# Patient Record
Sex: Female | Born: 1945
Health system: Southern US, Community
[De-identification: ages and names within clinical notes are randomized; demographics above are authoritative.]

## PROBLEM LIST (undated history)

## (undated) DIAGNOSIS — Z8489 Family history of other specified conditions: Secondary | ICD-10-CM

## (undated) DIAGNOSIS — R600 Localized edema: Secondary | ICD-10-CM

## (undated) DIAGNOSIS — G43909 Migraine, unspecified, not intractable, without status migrainosus: Secondary | ICD-10-CM

## (undated) DIAGNOSIS — E7800 Pure hypercholesterolemia, unspecified: Secondary | ICD-10-CM

## (undated) DIAGNOSIS — K219 Gastro-esophageal reflux disease without esophagitis: Secondary | ICD-10-CM

## (undated) DIAGNOSIS — Z8719 Personal history of other diseases of the digestive system: Secondary | ICD-10-CM

## (undated) DIAGNOSIS — R011 Cardiac murmur, unspecified: Secondary | ICD-10-CM

## (undated) DIAGNOSIS — I872 Venous insufficiency (chronic) (peripheral): Secondary | ICD-10-CM

## (undated) DIAGNOSIS — Q245 Malformation of coronary vessels: Secondary | ICD-10-CM

## (undated) HISTORY — DX: Gastro-esophageal reflux disease without esophagitis: K21.9

## (undated) HISTORY — DX: Venous insufficiency (chronic) (peripheral): I87.2

## (undated) HISTORY — PX: COLONOSCOPY: SHX174

## (undated) HISTORY — DX: Malformation of coronary vessels: Q24.5

## (undated) HISTORY — PX: UPPER GI ENDOSCOPY: SHX6162

## (undated) HISTORY — DX: Localized edema: R60.0

## (undated) HISTORY — PX: LEG SURGERY: SHX1003

## (undated) HISTORY — PX: LASER ABLATION: SHX1947

## (undated) HISTORY — DX: Pure hypercholesterolemia, unspecified: E78.00

## (undated) HISTORY — PX: ABDOMINAL HYSTERECTOMY: SHX81

## (undated) HISTORY — PX: BUNIONECTOMY: SHX129

---

## 2003-05-31 ENCOUNTER — Ambulatory Visit (HOSPITAL_COMMUNITY): Admission: RE | Admit: 2003-05-31 | Discharge: 2003-05-31 | Payer: Self-pay | Admitting: *Deleted

## 2004-07-31 ENCOUNTER — Encounter: Admission: RE | Admit: 2004-07-31 | Discharge: 2004-07-31 | Payer: Self-pay | Admitting: Internal Medicine

## 2004-09-04 ENCOUNTER — Encounter: Admission: RE | Admit: 2004-09-04 | Discharge: 2004-09-04 | Payer: Self-pay | Admitting: Internal Medicine

## 2004-09-11 ENCOUNTER — Encounter: Admission: RE | Admit: 2004-09-11 | Discharge: 2004-09-11 | Payer: Self-pay | Admitting: Internal Medicine

## 2004-10-02 ENCOUNTER — Encounter: Admission: RE | Admit: 2004-10-02 | Discharge: 2004-10-02 | Payer: Self-pay | Admitting: Internal Medicine

## 2004-10-07 ENCOUNTER — Encounter: Admission: RE | Admit: 2004-10-07 | Discharge: 2004-10-07 | Payer: Self-pay | Admitting: Diagnostic Radiology

## 2004-11-19 ENCOUNTER — Encounter: Admission: RE | Admit: 2004-11-19 | Discharge: 2004-11-19 | Payer: Self-pay | Admitting: Interventional Radiology

## 2005-03-12 ENCOUNTER — Encounter: Admission: RE | Admit: 2005-03-12 | Discharge: 2005-03-12 | Payer: Self-pay | Admitting: Internal Medicine

## 2006-01-22 ENCOUNTER — Ambulatory Visit (HOSPITAL_COMMUNITY): Admission: RE | Admit: 2006-01-22 | Discharge: 2006-01-22 | Payer: Self-pay | Admitting: *Deleted

## 2010-08-08 NOTE — Op Note (Signed)
NAMECHARNETTE, Casey Woods               ACCOUNT NO.:  [id number]   MEDICAL RECORD NO.:  [id number]          PATIENT TYPE:  AMB   LOCATION:  ENDO                         FACILITY:  [facility]   PHYSICIAN:  Georgiana Spinner, M.D.    DATE OF BIRTH:  [birth year]   DATE OF PROCEDURE:  01/22/2006  DATE OF DISCHARGE:                                 OPERATIVE REPORT   PROCEDURE:  Upper endoscopy.   INDICATIONS:  Gastroesophageal reflux disease.   ANESTHESIA:  Demerol 50, Versed 5 mg.   PROCEDURE:  With the patient mildly sedated in the left lateral decubitus  position, the Olympus videoscopic endoscope was inserted and passed under  direct vision through the esophagus, which appeared normal. Then into the  stomach fundus, body, antrum, duodenal bulb, second portion duodenum were  visualized.  From this point the endoscope was slowly withdrawn, taking  circumferential views of duodenal mucosa; until the endoscope had been  pulled back into the stomach and placed in retroflexion, to view the stomach  from below.  The endoscope was straightened and withdrawn, taking  circumferential views of the remaining gastric and esophageal mucosa.  The  patient's vital signs, pulse oximetry remained stable.  The patient  tolerated the procedure well without apparent complication.   FINDINGS:  Unremarkable examination.   PLAN:  Have the patient follow up with me as needed.           ______________________________  Georgiana Spinner, M.D.     GMO/MEDQ  D:  01/22/2006  T:  01/22/2006  Job:  [id number]

## 2010-08-08 NOTE — Op Note (Signed)
NAMEALDEN, FEAGAN                         ACCOUNT NO.:  [id number]   MEDICAL RECORD NO.:  [id number]                   PATIENT TYPE:  AMB   LOCATION:  ENDO                                 FACILITY:  [facility]   PHYSICIAN:  Georgiana Spinner, M.D.                 DATE OF BIRTH:  07/18/1945   DATE OF PROCEDURE:  05/31/2003  DATE OF DISCHARGE:                                 OPERATIVE REPORT   PROCEDURE:  Colonoscopy.   INDICATIONS FOR PROCEDURE:  Colon cancer screening.   ANESTHESIA:  Demerol 70, Versed 7 mg.   PROCEDURE:  With the patient mildly sedated in the left lateral decubitus  position, the Olympus videoscopic colonoscope was inserted in the rectum and  passed under direct vision to the cecum identified by the ileocecal valve  and appendiceal orifice.  We entered into the terminal ileum, all these were  photographed.  From this point, the colonoscope was slowly withdrawn taking  circumferential views of the colonic mucosa stopping only in the rectum  which appeared normal on direct and retroflex view.  The endoscope was  straightened and withdrawn.  The patient's vital signs and pulse oximeter  remained stable.  The patient tolerated the procedure well without apparent  complications.   FINDINGS:  Tortuous colon, but otherwise, unremarkable examination.   PLAN:  Because of family history, consider repeat examination in about five  years.                                               Georgiana Spinner, M.D.    GMO/MEDQ  D:  05/31/2003  T:  05/31/2003  Job:  [id number]

## 2010-08-22 ENCOUNTER — Other Ambulatory Visit: Payer: Self-pay | Admitting: Internal Medicine

## 2010-08-22 ENCOUNTER — Ambulatory Visit
Admission: RE | Admit: 2010-08-22 | Discharge: 2010-08-22 | Disposition: A | Discharge status: Home / Self Care | Payer: BC Managed Care – PPO | Source: Ambulatory Visit | Attending: Internal Medicine | Admitting: Internal Medicine

## 2010-08-22 DIAGNOSIS — R609 Edema, unspecified: Secondary | ICD-10-CM

## 2010-10-17 ENCOUNTER — Encounter: Payer: BC Managed Care – PPO | Admitting: Vascular Surgery

## 2010-10-23 ENCOUNTER — Encounter: Payer: Self-pay | Admitting: Vascular Surgery

## 2010-10-24 ENCOUNTER — Ambulatory Visit (INDEPENDENT_AMBULATORY_CARE_PROVIDER_SITE_OTHER): Payer: BC Managed Care – PPO | Admitting: Vascular Surgery

## 2010-10-24 ENCOUNTER — Encounter: Payer: Self-pay | Admitting: Vascular Surgery

## 2010-10-24 VITALS — BP 135/75 | HR 66 | Resp 20 | Ht 71.0 in | Wt 223.8 lb

## 2010-10-24 DIAGNOSIS — I872 Venous insufficiency (chronic) (peripheral): Secondary | ICD-10-CM

## 2010-10-24 DIAGNOSIS — M7989 Other specified soft tissue disorders: Secondary | ICD-10-CM

## 2010-10-24 NOTE — Progress Notes (Signed)
[facility] [place]  Referred by: Dr. Merri Brunette  Reason for referral: Swollen L leg  History of Present Illness  Casey Woods is a 65 y.o. female who presents with chief complaint: L swollen leg.  Patient has previously undergone EVLA of R leg for CVI and had swelling in both legs at that time.  Her R leg swelling improved but her L leg swelling as worsened note.  She has been using compression stockings without sufficient response per the pt.  The patient has had no history of DVT,  history of varicose vein, history of venous stasis ulcers on left foot, no history of  Lymphedema and  history of skin changes in lower legs.  There is no family history of venous disorders.  She does intermittent have pain in both leg (L>R) c/w CVI.  Past Medical History  Diagnosis Date  . Venous insufficiency (chronic) (peripheral)   . Edema leg     left > right    Past Surgical History  Procedure Date  . Abdominal hysterectomy   . Laser ablation     right - varicose veins    History   Social History  . Marital Status: Divorced    Spouse Name: N/A    Number of Children: N/A  . Years of Education: N/A   Occupational History  . Not on file.   Social History Main Topics  . Smoking status: Never Smoker   . Smokeless tobacco: Not on file  . Alcohol Use: 0.0 oz/week    0 drink(s) per week  . Drug Use: Not on file  . Sexually Active: Not on file   Other Topics Concern  . Not on file   Social History Narrative  . No narrative on file    Family History  Problem Relation Age of Onset  . Other Mother     PAD w/ hx left BKA   . Heart attack Father     died age 28  . Heart disease Brother     died age 53- CHF    Current outpatient prescriptions:aspirin 81 MG tablet, Take 81 mg by mouth daily.  , Disp: , Rfl: ;  Calcium Citrate-Vitamin D (CITRACAL + D PO), Take by mouth. 315mg /200mg  strength; Take two tabs daily , Disp: , Rfl: ;  Cholecalciferol (VITAMIN D) 2000  UNITS tablet, Take 2,000 Units by mouth daily.  , Disp: , Rfl: ;  esomeprazole (NEXIUM) 40 MG capsule, Take 40 mg by mouth daily before breakfast.  , Disp: , Rfl:  Multiple Vitamins-Minerals (MULTIVITAMIN WITH MINERALS) tablet, Take 1 tablet by mouth daily.  , Disp: , Rfl: ;  glucosamine-chondroitin 500-400 MG tablet, Take 1 tablet by mouth 1 day or 1 dose.  , Disp: , Rfl: ;  pravastatin (PRAVACHOL) 10 MG tablet, Take 10 mg by mouth daily.  , Disp: , Rfl: ;  RABEprazole (ACIPHEX) 20 MG tablet, Take 20 mg by mouth daily.  , Disp: , Rfl:  spironolactone (ALDACTONE) 25 MG tablet, Take 25 mg by mouth daily. Half a tab q day , Disp: , Rfl:   Allergies as of 10/24/2010  . (No Known Allergies)    Review of Systems (Positive items in bold and italic, otherwise negative)  General: Weight loss, Weight gain, Loss of appetite, Fever  Neurologic: Dizziness, Blackouts, Headaches, Seizure  Ear/Nose/Throat: Change in eyesight, Change in hearing, Nose bleeds, Sore throat  Vascular: Pain in legs with walking, Pain in feet while lying flat, Non-healing  ulcer, Stroke, "Mini stroke", Slurred speech, Temporary blindness, Blood clot in vein, Phlebitis  Pulmonary: Home oxygen, Productive cough, Bronchitis, Coughing up blood, Asthma, Wheezing  Musculoskeletal: Arthritis, Joint pain, Muscle pain  Cardiac: Chest pain, Chest tightness/pressure, Shortness of breath when lying flat, Palpitations, Heart murmur, Arrythmia, Atrial fibrillation  Hematologic: Bleeding problems, Clotting disorder, Anemia  Psychiatric:  Depression, Anxiety, Attention deficit disorder  Gastrointestinal:  Black stool, Blood in stool, Peptic ulcer disease, Reflux, Hiatal hernia, Trouble swallowing, Diarrhea, Constipation  Urinary:  Kidney disease, Burning with urination, Frequent urination, Difficulty urinating  Skin: Ulcers, Rashes  Physical Examination  Filed Vitals:   10/24/10 1305  BP: 135/75  Pulse: 66  Resp: 20    General:  A&O x 3, WDWN  Head: [place]/AT  Ear/Nose/Throat: Hearing grossly intact, nares w/o erythema or drainage, oropharynx w/o Erythema/Exudate  Eyes: PERRLA, EOMI  Neck: Supple, no nuchal rigidity, no palpable LAD  Pulmonary: Sym exp, good air movt, CTAB, no rales, rhonchi, & wheezing  Cardiac: RRR, Nl S1, S2, no Murmurs, rubs or gallops  Vascular: Vessel Right Left  Radial Palpable Palpable  Ulnary Palpable Palpable  Brachial Palpable Palpable  Carotid Palpable, without bruit Palpable, without bruit  Aorta Non-palpable N/A  Femoral Palpable Palpable  Popliteal Non-palpable Non-palpable  PT Palpable Palpable  DP Palpable Palpable   Gastrointestinal: soft, NTND, -G/R, - HSM, - masses, - CVAT B  Musculoskeletal: M/S 5/5 throughout, Extremities without ischemic changes, chronic venous skin changes including bilateral lipodermatosclerosis, R leg edema 1+, L leg 1-2+, previously healed ulcer evident on L foot, healed defect in L leg  Neurologic: CN 2-12 intact, Pain and light touch intact in extremities, Motor exam as listed above  Psychiatric: Judgment intact, Mood & affect appropriate for pt's clinical situation  Dermatologic: See M/S exam for extremity exam, no rashes otherwise noted  Lymph : No Cervical, Axillary, or Inguinal lymphadenopathy   Non-Invasive Vascular Imaging  BLE Venous Insufficiency Duplex ordered  Outside Studies/Documentation 5 pages of outside documents were reviewed including: DVT studies  Medical Decision Making  [name] is a 65 y.o. female who presents with: BLE CVI s/p R GSV EVLA.   Based on the patient's history and examination, I recommend: B venous insufficiency duplex to evaluate for CVI.  She likely will have reflux on examination given the skin changes (R leg: C4, L leg: C5) and history  She should continue with her compression stockings.  I have referred her to my partners that perform vein procedures, Dr. Hart Rochester and Dr. Arbie Cookey for  evaluation for EVLA GSV on L leg.  The patient will follow up after her: BLE venous insufficiency duplex   Thank you for allowing [place] to participate in this patient's care.  Leonides Sake, MD [facility] [place] Office: [phone] Pager: [phone]

## 2010-10-24 NOTE — Progress Notes (Signed)
Ref by Dr. Renne Crigler; Chr. Venous insuff. and leg swelling; pt states left > right

## 2010-11-13 ENCOUNTER — Encounter: Payer: Self-pay | Admitting: Vascular Surgery

## 2010-12-22 ENCOUNTER — Encounter: Payer: Self-pay | Admitting: Vascular Surgery

## 2010-12-23 ENCOUNTER — Encounter: Payer: Self-pay | Admitting: Vascular Surgery

## 2010-12-23 ENCOUNTER — Ambulatory Visit (INDEPENDENT_AMBULATORY_CARE_PROVIDER_SITE_OTHER): Payer: BC Managed Care – PPO | Admitting: *Deleted

## 2010-12-23 ENCOUNTER — Ambulatory Visit (INDEPENDENT_AMBULATORY_CARE_PROVIDER_SITE_OTHER): Payer: BC Managed Care – PPO | Admitting: Vascular Surgery

## 2010-12-23 VITALS — BP 146/68 | HR 62 | Resp 18 | Ht 71.0 in | Wt 228.0 lb

## 2010-12-23 DIAGNOSIS — I83893 Varicose veins of bilateral lower extremities with other complications: Secondary | ICD-10-CM

## 2010-12-23 DIAGNOSIS — I83229 Varicose veins of left lower extremity with both ulcer of unspecified site and inflammation: Secondary | ICD-10-CM

## 2010-12-23 DIAGNOSIS — I83219 Varicose veins of right lower extremity with both ulcer of unspecified site and inflammation: Secondary | ICD-10-CM

## 2010-12-23 DIAGNOSIS — I872 Venous insufficiency (chronic) (peripheral): Secondary | ICD-10-CM

## 2010-12-23 DIAGNOSIS — L97919 Non-pressure chronic ulcer of unspecified part of right lower leg with unspecified severity: Secondary | ICD-10-CM

## 2010-12-23 NOTE — Progress Notes (Signed)
New vv bilateral hx of pain, swelling,discoloration, ulceration.

## 2010-12-23 NOTE — Progress Notes (Signed)
Subjective:     Patient ID: Casey Woods, female   DOB: April 06, 1945, 65 y.o.   MRN: [id number]  HPI [facility] [place] HISTORY AND PHYSICAL   Referring Physician: Dr. Merri Brunette History of Present Illness: This 65 year-old female is evaluated today for severe venous insufficiency of both lower extremities. She has previously had laser ablation of the right great saphenous vein performed at [facility] about 4 years ago. Since time she has developed severe worsening of the darkness of her skin in the left leg from the midcalf distally. She also has had a stasis ulcer on the top of her left foot which eventually healed with elastic compression stockings. She has no history of DVT or thrombophlebitis. She had no previous history of stasis ulcers she has no history of bleeding. He has severe aching throbbing and burning discomfort in both legs which worsen as the day progresses left worse than right. Past Medical History  Diagnosis Date  . Venous insufficiency (chronic) (peripheral)   . Edema leg     left > right    ROS: [x]  Positive   [ ]  Negative   [ ]  All sytems reviewed and are negative  General: [ ]  Weight loss, [ ]  Fever, [ ]  chills Neurologic: [ ]  Dizziness, [ ]  Blackouts, [ ]  Seizure [ ]  Stroke, [ ]  "Mini stroke", [ ]  Slurred speech, [ ]  Temporary blindness; [ ]  weakness in arms or legs, [ ]  Hoarseness Cardiac: [ ]  Chest pain/pressure, [ ]  Shortness of breath at rest [ ]  Shortness of breath with exertion, [ ]  Atrial fibrillation or irregular heartbeat Vascular: [ ]  Pain in legs with walking, [ ]  Pain in legs at rest, [ ]  Pain in legs at night,  [x ] Non-healing ulcer, [ ]  Blood clot in vein/DVT,   Pulmonary: [ ]  Home oxygen, [ ]  Productive cough, [ ]  Coughing up blood, [ ]  Asthma,  [ ]  Wheezing Musculoskeletal:  [ ]  Arthritis, [ ]  Low back pain, [ ]  Joint pain Hematologic: [ ]  Easy Bruising, [ ]  Anemia; [ ]  Hepatitis Gastrointestinal: [ ]  Blood in stool, [ ]   Gastroesophageal Reflux/heartburn, [ ]  Trouble swallowing Urinary: [ ]  chronic Kidney disease, [ ]  on HD - [ ]  MWF or [ ]  TTHS, [ ]  Burning with urination, [ ]  Difficulty urinating Skin: [ ]  Rashes, [ ]  Wounds Psychological: [ ]  Anxiety, [ ]  Depression   Social History History  Substance Use Topics  . Smoking status: Never Smoker   . Smokeless tobacco: Not on file  . Alcohol Use: 0.0 oz/week    0 drink(s) per week    Family History Family History  Problem Relation Age of Onset  . Other Mother     PAD w/ hx left BKA   . Heart attack Father     died age 73  . Heart disease Brother     died age 54- CHF    No Known Allergies  Current Outpatient Prescriptions  Medication Sig Dispense Refill  . aspirin 81 MG tablet Take 81 mg by mouth daily.        . Calcium Citrate-Vitamin D (CITRACAL + D PO) Take by mouth. 315mg /200mg  strength; Take two tabs daily       . Cholecalciferol (VITAMIN D) 2000 UNITS tablet Take 2,000 Units by mouth daily.        Marland Kitchen esomeprazole (NEXIUM) 40 MG capsule Take 40 mg by mouth daily before breakfast.        .  Multiple Vitamins-Minerals (MULTIVITAMIN WITH MINERALS) tablet Take 1 tablet by mouth daily.        Marland Kitchen glucosamine-chondroitin 500-400 MG tablet Take 1 tablet by mouth 1 day or 1 dose.        . pravastatin (PRAVACHOL) 10 MG tablet Take 10 mg by mouth daily.        . RABEprazole (ACIPHEX) 20 MG tablet Take 20 mg by mouth daily.        Marland Kitchen spironolactone (ALDACTONE) 25 MG tablet Take 25 mg by mouth daily. Half a tab q day         Physical Examination  Filed Vitals:   12/23/10 1134  BP: 146/68  Pulse: 62  Resp: 18    Body mass index is 31.80 kg/(m^2).  General:  WDWN in NAD Gait: Normal HENT: WNL Eyes: Pupils equal Pulmonary: normal non-labored breathing , without Rales, rhonchi,  wheezing Cardiac: RRR, without  Murmurs, rubs or gallops; No carotid bruits Abdomen: soft, NT, no masses Skin: no rashes, ulcers noted Vascular Exam/Pulses: left  leg is 3+ femoral popliteal and dorsalis pedis pulse palpable. There is severe hyperpigmentation and skin thickening in both legs from the midcalf to the ankles. There is chronic edema (1+). She has bulging varicosities in the left medial calf. There is evidence of a healed stasis ulcer on the dorsum of the left foot. Right leg has bulging varicosities a lateral calf down to the ankle. Right leg has femoral popliteal and dorsalis pedis pulses palpable at 3+.   Extremities without ischemic changes, no Gangrene , no cellulitis; no open wounds;  Musculoskeletal: no muscle wasting or atrophy  Neurologic: A&O X 3; Appropriate Affect ; SENSATION: normal; MOTOR FUNCTION:  moving all extremities equally. Speech is fluent/normal  Non-Invasive Vascular Imaging: today I ordered a venous duplex exam of both lower extremities. Following findings were noted. She has gross reflux in the left great saphenous vein and reflux in the deep system on the left. She also has reflux in the left small saphenous vein which is slightly smaller in caliber the right leg has a few areas of reflux in the great saphenous vein and around the distal thigh to knee but the vein is of small caliber. The right small saphenous vein has gross reflux and is a large vein. The right leg has reflux in the deep system also.  ASSESSMENT: severe bilateral venous insufficiencyPLAN: #1 long elastic compression stockings 20-30 mm gradient #2 elevate legs as much as her job we'll allow #3 ibuprofen when necessary for pain #4 return in 3 months. She has not had dramatic improvement she will need #1 laser ablation left great saphenous vein with 63 - 63 stabs phlebectomy. She will also need laser ablation of right small saphenous vein with possible staph phlebectomy. Return in 3 months for further evaluation     Review of Systems     Objective:   Physical Exam     Assessment:         Plan:

## 2010-12-29 NOTE — Procedures (Unsigned)
LOWER EXTREMITY VENOUS REFLUX EXAM  INDICATION:  Previous right GSV ablation in 2008.  Bilateral varicose veins with swelling.  EXAM:  Using color-flow imaging and pulse Doppler spectral analysis, the bilateral common femoral, superficial femoral, popliteal, posterior tibial, greater and lesser saphenous veins are evaluated.  There is evidence suggesting severe deep venous insufficiency in the bilateral lower extremities.  The left saphenofemoral junction is not competent with Reflux of >575milliseconds. The bilateral GSV is not competent with Reflux of >5104milliseconds with the caliber as described below.  The bilateral proximal short saphenous veins demonstrate incompetency. The right calibers range from 0.59 cm to 0.31 cm.  The left small saphenous vein calibers range from 0.36 cm to 0.31 cm.  GSV Diameter (used if found to be incompetent only)                                                  Right      Left Proximal Greater Saphenous Vein                  0.64 cm    0.85 cm Proximal-to-mid-thigh                            0.34 cm    0.66 cm Mid thigh                                        0.31 cm    0.93 cm  Distal thigh                                     0.29 cm    0.61 cm Knee                                             0.32 cm    0.64 cm  IMPRESSION: 1. Bilateral greater saphenous veins are not competent with reflux     >578milliseconds. 2. The bilateral great saphenous vein is not tortuous. 3. The deep venous system is not competent with reflux of     >556milliseconds. 4. The bilateral small saphenous veins are not competent with Reflux     of >581milliseconds.  ___________________________________________ Quita Skye Hart Rochester, M.D.  LT/MEDQ  D:  12/24/2010  T:  12/24/2010  Job:  [id number]

## 2011-01-16 ENCOUNTER — Telehealth: Payer: Self-pay

## 2011-01-16 NOTE — Telephone Encounter (Signed)
Pt. called with c/o area of swelling approx. "Nickel-size" on right lower leg. States that she was awakened last night with a sensation of a "cramp" in the noted area, and this AM noticed the nickel sized area that was tender and warm and "located in an area of a cluster of veins".  States her legs stay swollen, and that the raised area within the vein cluster is new.  Voiced concern about a blood clot.  Advised pt. to wear compression stockings, elevate leg, and use Ibuprofen, OTC, for tenderness and inflammation.  Discussed w Dr. Imogene Burn.  No new orders at this time/ agreed w/ nursing recommendation.  Pt. advised to call office if symptoms don't improve, or worsen.  Agrees w/ plan.

## 2011-01-16 NOTE — Telephone Encounter (Signed)
Message copied by Phillips Odor on Fri Jan 16, 2011  5:21 PM ------      Message from: Marcellus Scott      Created: Fri Jan 16, 2011  9:33 AM      Contact: [phone]       Casey Woods DOB: 1945-08-31 is having swelling in right leg. She wants to make sure she doesn't have a blood clot on upper right leg. Her tele# is [phone].            Thanks,      Revonda Standard

## 2011-01-17 ENCOUNTER — Emergency Department (HOSPITAL_BASED_OUTPATIENT_CLINIC_OR_DEPARTMENT_OTHER)
Admission: EM | Admit: 2011-01-17 | Discharge: 2011-01-17 | Disposition: A | Discharge status: Home / Self Care | Payer: BC Managed Care – PPO | Attending: Emergency Medicine | Admitting: Emergency Medicine

## 2011-01-17 ENCOUNTER — Encounter (HOSPITAL_BASED_OUTPATIENT_CLINIC_OR_DEPARTMENT_OTHER): Payer: Self-pay | Admitting: *Deleted

## 2011-01-17 DIAGNOSIS — I8001 Phlebitis and thrombophlebitis of superficial vessels of right lower extremity: Secondary | ICD-10-CM

## 2011-01-17 DIAGNOSIS — M79609 Pain in unspecified limb: Secondary | ICD-10-CM | POA: Insufficient documentation

## 2011-01-17 DIAGNOSIS — I8000 Phlebitis and thrombophlebitis of superficial vessels of unspecified lower extremity: Secondary | ICD-10-CM | POA: Insufficient documentation

## 2011-01-17 DIAGNOSIS — Z79899 Other long term (current) drug therapy: Secondary | ICD-10-CM | POA: Insufficient documentation

## 2011-01-17 NOTE — ED Provider Notes (Signed)
History  Scribed for Raeford Razor, MD, the patient was seen in MH08/MH08. The chart was scribed by Gilman Schmidt. The patients care was started at 6:51 PM. CSN: [id number] Arrival date & time: 01/17/2011  4:12 PM   First MD Initiated Contact with Patient 01/17/11 1816      Chief Complaint  Patient presents with  . Leg Pain    HPI Casey Woods is a 65 y.o. female who presents to the Emergency Department complaining of right leg pain. Pt states she sees vein specialist. Two days ago she noted hard painful sight on lateral r thigh. Additionally notes that it initially felt like a cramp. Concerned for blood clot. Denies hx of blood clot but multiple varicose veins and procedures for them.. Denies any fever, chills, chest pain, SOB, history or CP, or any other pain. There are no other associated symptoms and no other alleviating or aggravating factors.    Past Medical History  Diagnosis Date  . Venous insufficiency (chronic) (peripheral)   . Edema leg     left > right    Past Surgical History  Procedure Date  . Abdominal hysterectomy   . Laser ablation     right - varicose veins  . Leg surgery     Family History  Problem Relation Age of Onset  . Other Mother     PAD w/ hx left BKA   . Heart attack Father     died age 82  . Heart disease Brother     died age 55- CHF    History  Substance Use Topics  . Smoking status: Never Smoker   . Smokeless tobacco: Not on file  . Alcohol Use: 0.5 oz/week    1 drink(s) per week    OB History    Grav Para Term Preterm Abortions TAB SAB Ect Mult Living                  Review of Systems  Constitutional: Negative for fever and chills.  Respiratory: Negative for shortness of breath.   Musculoskeletal:       Soreness  Skin: Positive for color change.  All other systems reviewed and are negative.    Allergies  Beef-derived products  Home Medications   Current Outpatient Rx  Name Route Sig Dispense Refill  . ASPIRIN 81  MG PO TABS Oral Take 81 mg by mouth daily.      Marland Kitchen CITRACAL + D PO Oral Take by mouth. 315mg /200mg  strength; Take two tabs daily     . VITAMIN D 2000 UNITS PO TABS Oral Take 2,000 Units by mouth daily.      Marland Kitchen ESOMEPRAZOLE MAGNESIUM 40 MG PO CPDR Oral Take 40 mg by mouth daily before breakfast.      . GLUCOSAMINE-CHONDROITIN 500-400 MG PO TABS Oral Take 1 tablet by mouth 1 day or 1 dose.      . MULTI-VITAMIN/MINERALS PO TABS Oral Take 1 tablet by mouth daily.      Marland Kitchen PRAVASTATIN SODIUM 10 MG PO TABS Oral Take 10 mg by mouth daily.      Marland Kitchen RABEPRAZOLE SODIUM 20 MG PO TBEC Oral Take 20 mg by mouth daily.      Marland Kitchen SPIRONOLACTONE 25 MG PO TABS Oral Take 25 mg by mouth daily. Half a tab q day       BP 124/61  Pulse 77  Temp(Src) 98 F (36.7 C) (Oral)  Resp 20  Ht 5\' 11"  (1.803 m)  Wt 224  lb 13.9 oz (102 kg)  BMI 31.36 kg/m2  SpO2 97%  Physical Exam  Constitutional: She is oriented to person, place, and time. She appears well-developed and well-nourished.  Non-toxic appearance. She does not have a sickly appearance.  HENT:  Head: Normocephalic and atraumatic.  Eyes: Conjunctivae, EOM and lids are normal. Pupils are equal, round, and reactive to light. No scleral icterus.  Neck: Trachea normal and normal range of motion. Neck supple.  Cardiovascular: Regular rhythm and normal heart sounds.   Pulmonary/Chest: Effort normal and breath sounds normal.  Abdominal: Soft. Normal appearance. There is no tenderness. There is no rebound, no guarding and no CVA tenderness.  Musculoskeletal: Normal range of motion.       Superficial lesion R lateral mid thigh. Tender. Indurated. No fluctuance. Mild erythema. Legs symmetric. No calf tenderness. Neurovascularly intact distally.    Neurological: She is alert and oriented to person, place, and time. She has normal strength.       Good dp pulse bilaterally   Skin: Skin is warm, dry and intact. No rash noted.       ED Course  Procedures  DIAGNOSTIC  STUDIES: Oxygen Saturation is 97% on room air, normal by my interpretation.    COORDINATION OF CARE: 6:51pm:  - Patient evaluated by ED physician, US Venous Img Lower Unilateral Right    MDM  65yf with leg pain. Clinical exam consistent with superficial phlebitis. Think that [place] is reasonable but low clinical suspicion for DVT. outpt [place] arranged. Do not feel needs emergently done nor empiric anticoagulation in mean time. Consider infectious etiology but clinically less likely. Afebrile and clinically well appearing. Plan symptomatic tx with warm compresses/NSAIDs and outpt fu.   I personally preformed the services scribed in my presence. The recorded information has been reviewed and considered. Raeford Razor, MD.       Raeford Razor, MD 01/18/11 [phone]

## 2011-01-17 NOTE — ED Notes (Signed)
Peripheral Vascular assessment complete at 1615

## 2011-01-17 NOTE — ED Notes (Signed)
Pt states she sees vein specialist- 2 days ago she noticed ?thrombus in right upper thigh- states has been increasing in size- site is sore

## 2011-01-18 ENCOUNTER — Inpatient Hospital Stay (HOSPITAL_BASED_OUTPATIENT_CLINIC_OR_DEPARTMENT_OTHER): Admit: 2011-01-18 | Payer: BC Managed Care – PPO

## 2011-04-06 ENCOUNTER — Encounter: Payer: Self-pay | Admitting: Vascular Surgery

## 2011-04-07 ENCOUNTER — Encounter: Payer: Self-pay | Admitting: Vascular Surgery

## 2011-04-07 ENCOUNTER — Ambulatory Visit (INDEPENDENT_AMBULATORY_CARE_PROVIDER_SITE_OTHER): Payer: BC Managed Care – PPO | Admitting: Vascular Surgery

## 2011-04-07 VITALS — BP 136/57 | HR 76 | Resp 18 | Ht 71.0 in | Wt 216.0 lb

## 2011-04-07 DIAGNOSIS — I83893 Varicose veins of bilateral lower extremities with other complications: Secondary | ICD-10-CM | POA: Insufficient documentation

## 2011-04-07 DIAGNOSIS — I83009 Varicose veins of unspecified lower extremity with ulcer of unspecified site: Secondary | ICD-10-CM

## 2011-04-07 DIAGNOSIS — L97909 Non-pressure chronic ulcer of unspecified part of unspecified lower leg with unspecified severity: Secondary | ICD-10-CM

## 2011-04-07 NOTE — Progress Notes (Signed)
Subjective:     Patient ID: Casey Woods, female   DOB: [birth year], 66 y.o.   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 She has had previous laser ablation of her right great saphenous vein at [facility] 4 years ago she has had progressive skin darkness and a stasis ulcer in the left foot as well as aching throbbing burning discomfort and chronic edema of the past several years. This involves both lower extremities. She has severe pain in the left medial calf in the right posterior calf as well as the severe thickening of the skin distally. She has no history of DVT. She has had one episode of thrombophlebitis superficially in the left distal lateral thigh. She has been wearing long light elastic compression stockings 20-30 mm gradient and trying elevation as much as her job will allow and ibuprofen with no improvement in her symptoms.   Past Medical History  Diagnosis Date  . Venous insufficiency (chronic) (peripheral)   . Edema leg     left > right    History  Substance Use Topics  . Smoking status: Never Smoker   . Smokeless tobacco: Never Used  . Alcohol Use: 0.5 oz/week    1 drink(s) per week    Family History  Problem Relation Age of Onset  . Other Mother     PAD w/ hx left BKA   . Heart attack Father     died age 30  . Heart disease Brother     died age 34- CHF    Allergies  Allergen Reactions  . Beef-Derived Products     headache    Current outpatient prescriptions:aspirin 81 MG tablet, Take 81 mg by mouth daily.  , Disp: , Rfl: ;  Calcium Citrate-Vitamin D (CITRACAL + D PO), Take by mouth. 315mg /200mg  strength; Take two tabs daily , Disp: , Rfl: ;  Cholecalciferol (VITAMIN D) 2000 UNITS tablet, Take 2,000 Units by mouth daily.  , Disp: , Rfl: ;  esomeprazole (NEXIUM) 40 MG capsule, Take 40 mg by mouth daily before breakfast.  , Disp: , Rfl:  Multiple  Vitamins-Minerals (MULTIVITAMIN WITH MINERALS) tablet, Take 1 tablet by mouth daily.  , Disp: , Rfl: ;  glucosamine-chondroitin 500-400 MG tablet, Take 1 tablet by mouth 1 day or 1 dose.  , Disp: , Rfl: ;  pravastatin (PRAVACHOL) 10 MG tablet, Take 10 mg by mouth daily.  , Disp: , Rfl: ;  RABEprazole (ACIPHEX) 20 MG tablet, Take 20 mg by mouth daily.  , Disp: , Rfl:  spironolactone (ALDACTONE) 25 MG tablet, Take 25 mg by mouth daily. Half a tab q day , Disp: , Rfl:   BP 136/57  Pulse 76  Resp 18  Ht 5\' 11"  (1.803 m)  Wt 216 lb (97.977 kg)  BMI 30.13 kg/m2  Body mass index is 30.13 kg/(m^2).         Review of Systems denies chest pain, dyspnea on exertion, PND, orthopnea, hemoptysis the     Objective:   Physical Exam pressure 136/57 heart rate 76 respirations 18 General well-developed well-nourished female no apparent stress alert and oriented x3 Lungs no rhonchi or wheezing Lower extremity exam 3+ femoral and dorsalis pedis pulses palpable bilaterally. There is severe thickening of the skin from the mid calf to the ankle bilaterally with no active ulcerations but evidence of previous healed ulcerations. There are bulging varicosities in left medial calf the  right posterior calf.  Previous venous duplex exam in our office reveals the following #1 gross reflux in left great saphenous vein throughout #2 gross reflux in left small saphenous vein and #3 gross reflux in right small saphenous vein. 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 Reflux in left great saphenous and bilateral small saphenous veins Plan: Risks and benefits of been thoroughly discussed with patient      Believe we should proceed with #1 laser  ablation left great saphenous vein with 10-20 stab phlebectomy #2 laser ablation right small saphenous vein with 10-20 stab phlebectomy #3 laser ablation left small saphenous vein This has been thoroughly discussed with patient and she would like to proceed. She understands that there is deep venous insufficiency but because of her severe skin changes and recent ulcer left foot I think we should proceed with ablation of the superficial system. Will proceed with precertification to perform this in the near future

## 2011-04-22 ENCOUNTER — Other Ambulatory Visit: Payer: Self-pay | Admitting: *Deleted

## 2011-04-22 DIAGNOSIS — I83899 Varicose veins of unspecified lower extremities with other complications: Secondary | ICD-10-CM

## 2011-05-15 ENCOUNTER — Encounter: Payer: Self-pay | Admitting: Vascular Surgery

## 2011-05-18 ENCOUNTER — Encounter: Payer: Self-pay | Admitting: Vascular Surgery

## 2011-05-18 ENCOUNTER — Ambulatory Visit (INDEPENDENT_AMBULATORY_CARE_PROVIDER_SITE_OTHER): Payer: BC Managed Care – PPO | Admitting: Vascular Surgery

## 2011-05-18 VITALS — BP 172/78 | HR 65 | Resp 20 | Ht 71.0 in | Wt 220.0 lb

## 2011-05-18 DIAGNOSIS — I83893 Varicose veins of bilateral lower extremities with other complications: Secondary | ICD-10-CM

## 2011-05-18 NOTE — Progress Notes (Signed)
Subjective:     Patient ID: Casey Woods, female   DOB: 20-Dec-1945, 66 y.o.   MRN: [id number]  HPI this 66 year old female had laser ablation of the left great saphenous vein performed under local tumescent anesthesia today +10-20 stab phlebectomy for secondary varicosities. She had a total of 1770 J of energy utilized. She tolerated the procedure well.   Review of Systems     Objective:   Physical ExamBP 172/78  Pulse 65  Resp 20  Ht 5\' 11"  (1.803 m)  Wt 220 lb (99.791 kg)  BMI 30.68 kg/m2    Assessment:    well-tolerated laser ablation left great saphenous vein with 10-20 stab phlebectomy under local tumescent anesthesia    Plan:     Return in one week for a venous duplex exam to confirm closure left great saphenous vein Will then be scheduled for similar procedure in the left small saphenous vein and right small saphenous vein

## 2011-05-18 NOTE — Progress Notes (Signed)
Laser Ablation Procedure      Date: 05/18/2011    Casey Woods DOB:[birth year]  Consent signed: Yes  Surgeon:Casey Woods  Procedure: Laser Ablation: left Greater Saphenous Vein  BP 172/78  Pulse 65  Resp 20  Ht 5\' 11"  (1.803 m)  Wt 220 lb (99.791 kg)  BMI 30.68 kg/m2  Start time: 9:15   End time: 10:15  Tumescent Anesthesia: 425 cc 0.9% NaCl with 50 cc Lidocaine HCL with 1% Epi and 15 cc 8.4% NaHCO3  Local Anesthesia: 9 cc Lidocaine HCL and NaHCO3 (ratio 2:1)  Pulsed mode: Watts 15 Seconds 1 Pulses:1 Total Pulses:1770 Total Energy: 118 Total Time: 1:58   Stab Phlebectomy: 10-20 Sites: Calf  Patient tolerated procedure well: Yes  Notes:   Description of Procedure:  After marking the course of the saphenous vein and the secondary varicosities in the standing position, the patient was placed on the operating table in the supine position, and the left leg was prepped and draped in sterile fashion. Local anesthetic was administered, and under ultrasound guidance the saphenous vein was accessed with a micro needle and guide wire; then the micro puncture sheath was placed. A guide wire was inserted to the saphenofemoral junction, followed by a 5 french sheath.  The position of the sheath and then the laser fiber below the junction was confirmed using the ultrasound and visualization of the aiming beam.  Tumescent anesthesia was administered along the course of the saphenous vein using ultrasound guidance. Protective laser glasses were placed on the patient, and the laser was fired at 15 watt pulsed mode advancing 1-2 mm per sec.  For a total of 1770 joules.  A steri strip was applied to the puncture site.  The patient was then put into Trendelenburg position.  Local anesthetic was utilized overlying the marked varicosities.  Ten to 20 stab wounds were made using the tip of an 11 blade; and using the vein hook,  The phlebectomies were performed using a hemostat to avulse these  varicosities.  Adequate hemostasis was achieved, and steri strips were applied to the stab wound.    ABD pads and thigh high compression stockings were applied.  Ace wrap bandages were applied over the phlebectomy sites and at the top of the saphenofemoral junction.  Blood loss was less than 15 cc.  The patient ambulated out of the operating room having tolerated the procedure well.

## 2011-05-19 ENCOUNTER — Encounter: Payer: Self-pay | Admitting: Vascular Surgery

## 2011-05-19 ENCOUNTER — Telehealth: Payer: Self-pay | Admitting: *Deleted

## 2011-05-19 NOTE — Telephone Encounter (Signed)
Left a voice mail for the patient to call me if she needs anything. Reminded her of her fu visit on 3/5.

## 2011-05-25 ENCOUNTER — Encounter: Payer: Self-pay | Admitting: Vascular Surgery

## 2011-05-26 ENCOUNTER — Encounter (INDEPENDENT_AMBULATORY_CARE_PROVIDER_SITE_OTHER): Payer: BC Managed Care – PPO | Admitting: *Deleted

## 2011-05-26 ENCOUNTER — Encounter: Payer: Self-pay | Admitting: Vascular Surgery

## 2011-05-26 ENCOUNTER — Ambulatory Visit (INDEPENDENT_AMBULATORY_CARE_PROVIDER_SITE_OTHER): Payer: BC Managed Care – PPO | Admitting: Vascular Surgery

## 2011-05-26 VITALS — BP 130/61 | HR 78 | Resp 18 | Ht 71.0 in | Wt 227.0 lb

## 2011-05-26 DIAGNOSIS — I83893 Varicose veins of bilateral lower extremities with other complications: Secondary | ICD-10-CM

## 2011-05-26 DIAGNOSIS — Z48812 Encounter for surgical aftercare following surgery on the circulatory system: Secondary | ICD-10-CM

## 2011-05-26 NOTE — Progress Notes (Signed)
Subjective:     Patient ID: Casey Woods, female   DOB: March 12, 1946, 66 y.o.   MRN: [id number]  HPI this 66 year old female had laser ablation of left great saphenous vein with 10-20 stab phlebectomy performed one week ago for painful varicosities and severe skin changes due to valvular reflux and venous hypertension. She tolerated the procedure well. She has had no change in her distal edema. She has some mild discomfort along the course of the great saphenous vein and is warm her  stockings as instructed and taken ibuprofen.  Review of Systems     Objective:   Physical ExamBP 130/61  Pulse 78  Resp 18  Ht 5\' 11"  (1.803 m)  Wt 227 lb (102.967 kg)  BMI 31.66 kg/m2 Left leg with mild to moderate discomfort along the course of the great saphenous vein from the saphenofemoral junction to the knee. Saphenectomy sites are healing well. Hyperpigmentation is unchanged in the distal leg. There is no distal edema noted.  Today I ordered a venous duplex exam of the left leg which are reviewed and interpreted. There is closure of the left great saphenous vein with no DVT.    Assessment:     Doing well post laser ablation and stab phlebectomy left leg.    Plan:     Plan laser ablation right small saphenous vein with 10-20 stab phlebectomy on March 25 to be followed by final procedure on left small saphenous vein in April

## 2011-06-03 NOTE — Procedures (Unsigned)
DUPLEX DEEP VENOUS EXAM - LOWER EXTREMITY  INDICATION:  One week left GSV ablation followup.  HISTORY:  Edema:  Yes Trauma/Surgery:  EVLT Pain:  Yes PE:  No Previous DVT:  No Anticoagulants:  No Other:  DUPLEX EXAM:               CFV   SFV   PopV  PTV    GSV               R  L  R  L  R  L  R   L  R  L Thrombosis       o     o     o      o     + Spontaneous      +     +     +            o Phasic           +     +     +            o Augmentation     +     +     +      +     o Compressible     +     +     +      +     o Competent  Legend:  + - yes  o - no  p - partial  D - decreased  IMPRESSION:  Successful ablation of the left greater saphenous vein approximately 1.2 cm distal to the junction through the insertion point. No deep venous involvement was observed.   _____________________________ Quita Skye. Hart [place], M.D.  LT/MEDQ  D:  05/26/2011  T:  05/26/2011  Job:  [id number]

## 2011-06-09 ENCOUNTER — Other Ambulatory Visit: Payer: Self-pay | Admitting: *Deleted

## 2011-06-09 DIAGNOSIS — I83893 Varicose veins of bilateral lower extremities with other complications: Secondary | ICD-10-CM

## 2011-06-12 ENCOUNTER — Encounter: Payer: Self-pay | Admitting: Vascular Surgery

## 2011-06-15 ENCOUNTER — Ambulatory Visit (INDEPENDENT_AMBULATORY_CARE_PROVIDER_SITE_OTHER): Payer: BC Managed Care – PPO | Admitting: Vascular Surgery

## 2011-06-15 ENCOUNTER — Encounter: Payer: Self-pay | Admitting: Vascular Surgery

## 2011-06-15 VITALS — BP 154/82 | HR 74 | Resp 16 | Ht 71.0 in | Wt 221.0 lb

## 2011-06-15 DIAGNOSIS — I83893 Varicose veins of bilateral lower extremities with other complications: Secondary | ICD-10-CM

## 2011-06-15 NOTE — Progress Notes (Signed)
Subjective:     Patient ID: Casey Woods, female   DOB: Feb 27, 1946, 66 y.o.   MRN: [id number]  HPI this 66 year old female had laser ablation of the right small saphenous vein and 10-20 stab phlebectomy of secondary varicosities done under local tumescent anesthesia today. She tolerated the procedure well. Her tissue in the lower third of the right leg is very thickened and leathery.  Review of Systems     Objective:   Physical ExamBP 154/82  Pulse 74  Resp 16  Ht 5\' 11"  (1.803 m)  Wt 221 lb (100.245 kg)  BMI 30.82 kg/m2        Assessment:    well-tolerated laser ablation right small saphenous vein with greater then 10 stab phlebectomy performed under local tumescent anesthesia    Plan:     Return in one week for venous duplex exam to confirm closure right small saphenous vein and will then have left small saphenous laser ablation performed

## 2011-06-15 NOTE — Progress Notes (Signed)
Laser Ablation Procedure      Date: 06/15/2011    Casey Woods DOB:1945/05/11  Consent signed: Yes  Surgeon:J.D. Hart [place]  Procedure: Laser Ablation: right Small Saphenous Vein  BP 154/82  Pulse 74  Resp 16  Ht 5\' 11"  (1.803 m)  Wt 221 lb (100.245 kg)  BMI 30.82 kg/m2  Start time: 9:30   End time: 10:40  Tumescent Anesthesia: 325 cc 0.9% NaCl with 50 cc Lidocaine HCL with 1% Epi and 15 cc 8.4% NaHCO3  Local Anesthesia: 18 cc Lidocaine HCL and NaHCO3 (ratio 2:1)  Pulsed mode:15 Watts 1 Seconds 1 Pulses: Total Pulses:74 Total Energy: 1097 Total Time: 1:13   Stab Phlebectomy: 10-20 Sites: Calf  Patient tolerated procedure well: Yes  Description of Procedure:  After marking the course of the saphenous vein and the secondary varicosities in the standing position, the patient was placed on the operating table in the prone position, and the right leg was prepped and draped in sterile fashion. Local anesthetic was administered, and under ultrasound guidance the saphenous vein was accessed with a micro needle and guide wire; then the micro puncture sheath was placed. A guide wire was inserted to the saphenopopliteal junction, followed by a 5 french sheath.  The position of the sheath and then the laser fiber below the junction was confirmed using the ultrasound and visualization of the aiming beam.  Tumescent anesthesia was administered along the course of the saphenous vein using ultrasound guidance. Protective laser glasses were placed on the patient, and the laser was fired at 15 watt pulsed mode advancing 1-2 mm per sec.  For a total of 1097 joules.  A steri strip was applied to the puncture site.  The patient was then put into Trendelenburg position.  Local anesthetic was utilized overlying the marked varicosities.  Ten to 20 stab wounds were made using the tip of an 11 blade; and using the vein hook,  The phlebectomies were performed using a hemostat to avulse these varicosities.   Adequate hemostasis was achieved, and steri strips were applied to the stab wound.    ABD pads and thigh high compression stockings were applied.  Ace wrap bandages were applied over the phlebectomy sites and at the top of the saphenopopliteal junction.  Blood loss was less than 15 cc.  The patient ambulated out of the operating room having tolerated the procedure well.

## 2011-06-16 ENCOUNTER — Telehealth: Payer: Self-pay | Admitting: *Deleted

## 2011-06-16 ENCOUNTER — Other Ambulatory Visit: Payer: Self-pay | Admitting: *Deleted

## 2011-06-16 ENCOUNTER — Encounter: Payer: Self-pay | Admitting: Vascular Surgery

## 2011-06-16 DIAGNOSIS — I83893 Varicose veins of bilateral lower extremities with other complications: Secondary | ICD-10-CM

## 2011-06-16 NOTE — Telephone Encounter (Signed)
Reached patient at work. No problems or bleeding. Following all instructions. Reminded her of her fu appts. Next week.

## 2011-06-22 ENCOUNTER — Encounter: Payer: Self-pay | Admitting: Vascular Surgery

## 2011-06-23 ENCOUNTER — Ambulatory Visit (INDEPENDENT_AMBULATORY_CARE_PROVIDER_SITE_OTHER): Payer: BC Managed Care – PPO | Admitting: Vascular Surgery

## 2011-06-23 ENCOUNTER — Encounter (INDEPENDENT_AMBULATORY_CARE_PROVIDER_SITE_OTHER): Payer: BC Managed Care – PPO | Admitting: *Deleted

## 2011-06-23 ENCOUNTER — Encounter: Payer: Self-pay | Admitting: Vascular Surgery

## 2011-06-23 VITALS — BP 152/63 | HR 62 | Resp 16 | Ht 71.0 in | Wt 221.0 lb

## 2011-06-23 DIAGNOSIS — I83893 Varicose veins of bilateral lower extremities with other complications: Secondary | ICD-10-CM

## 2011-06-23 DIAGNOSIS — Z48812 Encounter for surgical aftercare following surgery on the circulatory system: Secondary | ICD-10-CM

## 2011-06-23 NOTE — Progress Notes (Signed)
Subjective:     Patient ID: Casey Woods, female   DOB: 1945-08-24, 66 y.o.   MRN: [id number]  HPI this 66 year old female is one week post laser ablation right small saphenous vein with 10-20 stab phlebectomy performed under local tumescent anesthesia. She tolerated the procedure well last week. She has had mild/moderate discomfort along the course of the small saphenous vein particularly directly behind the knee joint she did notice some slight blistering for a day or 2 which resolved. She's had no severe redness. She has taken the ibuprofen as instructed and has tolerated it well on 1 long-leg elastic compression stocking.   Review of Systems     Objective:   Physical ExamBP 152/63  Pulse 62  Resp 16  Ht 5\' 11"  (1.803 m)  Wt 221 lb (100.245 kg)  BMI 30.82 kg/m2  General well-developed well-nourished female in no apparent distress Right leg with mild/moderate tenderness along course of small saphenous vein area chronic skin changes lower third of the leg with thick hyper trophic skin but no active ulceration. Stab phlebectomy sites healing nicely.   Today I ordered a venous duplex exam of the right leg which are reviewed and interpreted. The small saphenous vein in the right leg is totally closed up to the popliteal vein. There is no DVT or extension into the deep system.    Assessment:     Successful and well-tolerated ablation right small saphenous vein with multiple stab phlebectomy    Plan:     Return April 29 her laser ablation left small saphenous vein Have recommended that patient continue to wear short-leg compression stockings on chronic basis because of chronic skin changes

## 2011-07-13 ENCOUNTER — Other Ambulatory Visit: Payer: BC Managed Care – PPO | Admitting: Vascular Surgery

## 2011-07-17 ENCOUNTER — Encounter: Payer: Self-pay | Admitting: Vascular Surgery

## 2011-07-20 ENCOUNTER — Encounter: Payer: Self-pay | Admitting: Vascular Surgery

## 2011-07-20 ENCOUNTER — Ambulatory Visit (INDEPENDENT_AMBULATORY_CARE_PROVIDER_SITE_OTHER): Payer: BC Managed Care – PPO | Admitting: Vascular Surgery

## 2011-07-20 VITALS — BP 143/72 | HR 68 | Resp 18 | Ht 71.0 in | Wt 221.0 lb

## 2011-07-20 DIAGNOSIS — I83893 Varicose veins of bilateral lower extremities with other complications: Secondary | ICD-10-CM

## 2011-07-20 NOTE — Progress Notes (Signed)
Subjective:     Patient ID: Casey Woods, female   DOB: 1945/12/30, 66 y.o.   MRN: [id number]  HPI this 66 year old female had laser ablation of the left small saphenous vein performed under local tumescent anesthesia for painful varicosities in the left posterior calf with venous hypertension. She tolerated the procedure well. This completes her course of treatment   Review of Systems     Objective:   Physical ExamBP 143/72  Pulse 68  Resp 18  Ht 5\' 11"  (1.803 m)  Wt 221 lb (100.245 kg)  BMI 30.82 kg/m2      Assessment:    well-tolerated laser ablation left small saphenous vein performed under local tumescent anesthesia for venous hypertension and painful varicosities    Plan:     Return in one week for venous duplex exam to confirm closure left small saphenous vein This will complete her treatment regimen

## 2011-07-20 NOTE — Progress Notes (Signed)
Laser Ablation Procedure      Date: 07/20/2011    Casey Woods DOB:02/10/1946  Consent signed: Yes  Surgeon:J.D. Hart Rochester  Procedure: Laser Ablation: left Small Saphenous Vein  BP 143/72  Pulse 68  Resp 18  Ht 5\' 11"  (1.803 m)  Wt 221 lb (100.245 kg)  BMI 30.82 kg/m2  Start time: 11:00   End time: 11:30  Tumescent Anesthesia: 200 cc 0.9% NaCl with 50 cc Lidocaine HCL with 1% Epi and 15 cc 8.4% NaHCO3  Local Anesthesia: 5 cc Lidocaine HCL and NaHCO3 (ratio 2:1)  Pulsed mode: Watts 15 Seconds 1 Pulses:1 Total Pulses:75 Total Energy: 1125 Total Time: 1:15    Patient tolerated procedure well: Yes   Description of Procedure:  After marking the course of the saphenous vein and the secondary varicosities in the standing position, the patient was placed on the operating table in the prone position, and the left leg was prepped and draped in sterile fashion. Local anesthetic was administered, and under ultrasound guidance the saphenous vein was accessed with a micro needle and guide wire; then the micro puncture sheath was placed. A guide wire was inserted to the saphenopopliteal junction, followed by a 5 french sheath.  The position of the sheath and then the laser fiber below the junction was confirmed using the ultrasound and visualization of the aiming beam.  Tumescent anesthesia was administered along the course of the saphenous vein using ultrasound guidance. Protective laser glasses were placed on the patient, and the laser was fired at 15 watt pulsed mode advancing 1-2 mm per sec.  For a total of 1125 joules.  A steri strip was applied to the puncture site.     ABD pads and thigh high compression stockings were applied.  Ace wrap bandages were applied over the phlebectomy sites and at the top of the saphenopopliteal junction.  Blood loss was less than 15 cc.  The patient ambulated out of the operating room having tolerated the procedure well.

## 2011-07-21 ENCOUNTER — Ambulatory Visit: Payer: BC Managed Care – PPO | Admitting: Vascular Surgery

## 2011-07-21 ENCOUNTER — Encounter: Payer: Self-pay | Admitting: Vascular Surgery

## 2011-07-21 ENCOUNTER — Telehealth: Payer: Self-pay | Admitting: *Deleted

## 2011-07-21 NOTE — Telephone Encounter (Signed)
No answer so left a message for her to call me if she was having any problems.

## 2011-07-27 ENCOUNTER — Encounter: Payer: Self-pay | Admitting: Vascular Surgery

## 2011-07-28 ENCOUNTER — Encounter (INDEPENDENT_AMBULATORY_CARE_PROVIDER_SITE_OTHER): Payer: BC Managed Care – PPO | Admitting: *Deleted

## 2011-07-28 ENCOUNTER — Encounter: Payer: Self-pay | Admitting: Vascular Surgery

## 2011-07-28 ENCOUNTER — Ambulatory Visit (INDEPENDENT_AMBULATORY_CARE_PROVIDER_SITE_OTHER): Payer: BC Managed Care – PPO | Admitting: Vascular Surgery

## 2011-07-28 VITALS — BP 150/75 | HR 65 | Resp 18 | Ht 71.0 in | Wt 223.0 lb

## 2011-07-28 DIAGNOSIS — I83893 Varicose veins of bilateral lower extremities with other complications: Secondary | ICD-10-CM

## 2011-07-28 DIAGNOSIS — Z48812 Encounter for surgical aftercare following surgery on the circulatory system: Secondary | ICD-10-CM

## 2011-07-28 NOTE — Progress Notes (Signed)
Subjective:     Patient ID: Casey Woods, female   DOB: 09/07/1945, 66 y.o.   MRN: [id number]  HPI this 66 year old female had laser ablation of left small saphenous vein performed under local tumescent anesthesia 2 weeks ago. She tolerated the procedure well. She returns today for initial followup. She has had some mild to moderate discomfort along the course of the small saphenous vein. There has been no change in distal edema. She took her ibuprofen and were elastic compression stockings as prescribed. She has had no chest pain, hemoptysis, or dyspnea on exertion.  Past Medical History  Diagnosis Date  . Venous insufficiency (chronic) (peripheral)   . Edema leg     left > right    History  Substance Use Topics  . Smoking status: Never Smoker   . Smokeless tobacco: Never Used  . Alcohol Use: 0.5 oz/week    1 drink(s) per week    Family History  Problem Relation Age of Onset  . Other Mother     PAD w/ hx left BKA   . Heart attack Father     died age 75  . Heart disease Brother     died age 67- CHF    Allergies  Allergen Reactions  . Beef-Derived Products     headache    Current outpatient prescriptions:aspirin 81 MG tablet, Take 81 mg by mouth daily.  , Disp: , Rfl: ;  Calcium Citrate-Vitamin D (CITRACAL + D PO), Take by mouth. 315mg /200mg  strength; Take two tabs daily , Disp: , Rfl: ;  Cholecalciferol (VITAMIN D) 2000 UNITS tablet, Take 2,000 Units by mouth daily.  , Disp: , Rfl: ;  esomeprazole (NEXIUM) 40 MG capsule, Take 40 mg by mouth daily before breakfast.  , Disp: , Rfl:  glucosamine-chondroitin 500-400 MG tablet, Take 1 tablet by mouth 1 day or 1 dose.  , Disp: , Rfl: ;  Multiple Vitamins-Minerals (MULTIVITAMIN WITH MINERALS) tablet, Take 1 tablet by mouth daily.  , Disp: , Rfl: ;  pravastatin (PRAVACHOL) 10 MG tablet, Take 10 mg by mouth daily.  , Disp: , Rfl: ;  RABEprazole (ACIPHEX) 20 MG tablet, Take 20 mg by mouth daily.  , Disp: , Rfl:  spironolactone  (ALDACTONE) 25 MG tablet, Take 25 mg by mouth daily. Half a tab q day , Disp: , Rfl:   BP 150/75  Pulse 65  Resp 18  Ht 5\' 11"  (1.803 m)  Wt 223 lb (101.152 kg)  BMI 31.10 kg/m2  Body mass index is 31.10 kg/(m^2).           Review of Systems     Objective:   Physical Exam blood pressure 150/75 heart rate 65 respirations 18 General well-developed well-nourished female in no apparent distress alert and oriented x3 Lungs no rhonchi or wheezing next left lower extremity there is chronic hyperpigmentation in the lower third of the left leg with chronic 1+ edema. Mild tenderness along the course of left small saphenous vein. 3+ dorsalis pedis pulses palpable.  Today I ordered a venous duplex exam of the left leg which I reviewed and interpreted. There is total closure of the left small saphenous vein with no DVT.     Assessment:     Well-tolerated laser ablation left small saphenous vein with good closure by ultrasound Chronic skin changes bilaterally due to venous hypertension    Plan:     Patient will wear short leg elastic compression stockings on chronic basis return to see [place] on  a when necessary basis

## 2011-08-03 NOTE — Procedures (Unsigned)
DUPLEX DEEP VENOUS EXAM - LOWER EXTREMITY  INDICATION:  One week status post ablation of left small saphenous vein.  HISTORY:  Edema:  Yes. Trauma/Surgery:  Ablation. Pain:  Yes. PE:  No. Previous DVT:  No. Anticoagulants:  No. Other:  DUPLEX EXAM:               CFV   SFV   PopV  PTV    GSV               R  L  R  L  R  L  R   L  R  L Thrombosis                   o      o Spontaneous                  +      + Phasic                       +      + Augmentation                 +      + Compressible                 +      + Competent  Legend:  + - yes  o - no  p - partial  D - decreased  IMPRESSION:  Successful ablation of left small saphenous vein without evidence of deep venous involvement.   _____________________________ Quita Skye Hart [place], M.D.  LT/MEDQ  D:  07/28/2011  T:  07/28/2011  Job:  [id number]

## 2012-12-16 DIAGNOSIS — J209 Acute bronchitis, unspecified: Secondary | ICD-10-CM | POA: Diagnosis not present

## 2013-01-02 ENCOUNTER — Ambulatory Visit
Admission: RE | Admit: 2013-01-02 | Discharge: 2013-01-02 | Disposition: A | Discharge status: Home / Self Care | Payer: Medicare Other | Source: Ambulatory Visit | Attending: Internal Medicine | Admitting: Internal Medicine

## 2013-01-02 ENCOUNTER — Other Ambulatory Visit: Payer: Self-pay | Admitting: Internal Medicine

## 2013-01-02 DIAGNOSIS — R10819 Abdominal tenderness, unspecified site: Secondary | ICD-10-CM

## 2013-01-02 DIAGNOSIS — R109 Unspecified abdominal pain: Secondary | ICD-10-CM | POA: Diagnosis not present

## 2013-01-02 DIAGNOSIS — R1031 Right lower quadrant pain: Secondary | ICD-10-CM | POA: Diagnosis not present

## 2013-01-02 MED ORDER — IOHEXOL 300 MG/ML  SOLN
125.0000 mL | Freq: Once | INTRAMUSCULAR | Status: AC | PRN
  Administered 2013-01-02: 125 mL via INTRAVENOUS

## 2013-01-06 DIAGNOSIS — Z23 Encounter for immunization: Secondary | ICD-10-CM | POA: Diagnosis not present

## 2013-01-11 DIAGNOSIS — R1031 Right lower quadrant pain: Secondary | ICD-10-CM | POA: Diagnosis not present

## 2013-04-03 DIAGNOSIS — M545 Low back pain, unspecified: Secondary | ICD-10-CM | POA: Diagnosis not present

## 2013-04-03 DIAGNOSIS — M47817 Spondylosis without myelopathy or radiculopathy, lumbosacral region: Secondary | ICD-10-CM | POA: Diagnosis not present

## 2013-04-10 DIAGNOSIS — M5137 Other intervertebral disc degeneration, lumbosacral region: Secondary | ICD-10-CM | POA: Diagnosis not present

## 2013-04-10 DIAGNOSIS — M999 Biomechanical lesion, unspecified: Secondary | ICD-10-CM | POA: Diagnosis not present

## 2013-04-11 DIAGNOSIS — M999 Biomechanical lesion, unspecified: Secondary | ICD-10-CM | POA: Diagnosis not present

## 2013-04-11 DIAGNOSIS — M5137 Other intervertebral disc degeneration, lumbosacral region: Secondary | ICD-10-CM | POA: Diagnosis not present

## 2013-04-12 DIAGNOSIS — M5137 Other intervertebral disc degeneration, lumbosacral region: Secondary | ICD-10-CM | POA: Diagnosis not present

## 2013-04-12 DIAGNOSIS — M999 Biomechanical lesion, unspecified: Secondary | ICD-10-CM | POA: Diagnosis not present

## 2013-04-13 DIAGNOSIS — M5137 Other intervertebral disc degeneration, lumbosacral region: Secondary | ICD-10-CM | POA: Diagnosis not present

## 2013-04-13 DIAGNOSIS — M999 Biomechanical lesion, unspecified: Secondary | ICD-10-CM | POA: Diagnosis not present

## 2013-04-17 DIAGNOSIS — M5137 Other intervertebral disc degeneration, lumbosacral region: Secondary | ICD-10-CM | POA: Diagnosis not present

## 2013-04-17 DIAGNOSIS — M999 Biomechanical lesion, unspecified: Secondary | ICD-10-CM | POA: Diagnosis not present

## 2013-04-18 DIAGNOSIS — M5137 Other intervertebral disc degeneration, lumbosacral region: Secondary | ICD-10-CM | POA: Diagnosis not present

## 2013-04-18 DIAGNOSIS — M999 Biomechanical lesion, unspecified: Secondary | ICD-10-CM | POA: Diagnosis not present

## 2013-04-19 DIAGNOSIS — M999 Biomechanical lesion, unspecified: Secondary | ICD-10-CM | POA: Diagnosis not present

## 2013-04-19 DIAGNOSIS — M5137 Other intervertebral disc degeneration, lumbosacral region: Secondary | ICD-10-CM | POA: Diagnosis not present

## 2013-04-24 DIAGNOSIS — M999 Biomechanical lesion, unspecified: Secondary | ICD-10-CM | POA: Diagnosis not present

## 2013-04-24 DIAGNOSIS — M5137 Other intervertebral disc degeneration, lumbosacral region: Secondary | ICD-10-CM | POA: Diagnosis not present

## 2013-04-25 DIAGNOSIS — M999 Biomechanical lesion, unspecified: Secondary | ICD-10-CM | POA: Diagnosis not present

## 2013-04-25 DIAGNOSIS — M5137 Other intervertebral disc degeneration, lumbosacral region: Secondary | ICD-10-CM | POA: Diagnosis not present

## 2013-04-26 DIAGNOSIS — M5137 Other intervertebral disc degeneration, lumbosacral region: Secondary | ICD-10-CM | POA: Diagnosis not present

## 2013-04-26 DIAGNOSIS — M999 Biomechanical lesion, unspecified: Secondary | ICD-10-CM | POA: Diagnosis not present

## 2013-05-01 DIAGNOSIS — M999 Biomechanical lesion, unspecified: Secondary | ICD-10-CM | POA: Diagnosis not present

## 2013-05-01 DIAGNOSIS — J069 Acute upper respiratory infection, unspecified: Secondary | ICD-10-CM | POA: Diagnosis not present

## 2013-05-01 DIAGNOSIS — M5137 Other intervertebral disc degeneration, lumbosacral region: Secondary | ICD-10-CM | POA: Diagnosis not present

## 2013-05-02 DIAGNOSIS — M999 Biomechanical lesion, unspecified: Secondary | ICD-10-CM | POA: Diagnosis not present

## 2013-05-02 DIAGNOSIS — M5137 Other intervertebral disc degeneration, lumbosacral region: Secondary | ICD-10-CM | POA: Diagnosis not present

## 2013-06-30 DIAGNOSIS — K219 Gastro-esophageal reflux disease without esophagitis: Secondary | ICD-10-CM | POA: Diagnosis not present

## 2013-06-30 DIAGNOSIS — E7800 Pure hypercholesterolemia, unspecified: Secondary | ICD-10-CM | POA: Diagnosis not present

## 2013-06-30 DIAGNOSIS — Z0000 Encounter for general adult medical examination without abnormal findings: Secondary | ICD-10-CM | POA: Diagnosis not present

## 2013-07-05 DIAGNOSIS — M899 Disorder of bone, unspecified: Secondary | ICD-10-CM | POA: Diagnosis not present

## 2013-07-05 DIAGNOSIS — Z0000 Encounter for general adult medical examination without abnormal findings: Secondary | ICD-10-CM | POA: Diagnosis not present

## 2013-07-05 DIAGNOSIS — I872 Venous insufficiency (chronic) (peripheral): Secondary | ICD-10-CM | POA: Diagnosis not present

## 2013-07-05 DIAGNOSIS — I059 Rheumatic mitral valve disease, unspecified: Secondary | ICD-10-CM | POA: Diagnosis not present

## 2013-08-16 DIAGNOSIS — R7309 Other abnormal glucose: Secondary | ICD-10-CM | POA: Diagnosis not present

## 2013-08-22 DIAGNOSIS — K219 Gastro-esophageal reflux disease without esophagitis: Secondary | ICD-10-CM | POA: Diagnosis not present

## 2013-08-22 DIAGNOSIS — R7309 Other abnormal glucose: Secondary | ICD-10-CM | POA: Diagnosis not present

## 2013-08-31 DIAGNOSIS — K5730 Diverticulosis of large intestine without perforation or abscess without bleeding: Secondary | ICD-10-CM | POA: Diagnosis not present

## 2013-08-31 DIAGNOSIS — D129 Benign neoplasm of anus and anal canal: Secondary | ICD-10-CM | POA: Diagnosis not present

## 2013-08-31 DIAGNOSIS — Z86010 Personal history of colonic polyps: Secondary | ICD-10-CM | POA: Diagnosis not present

## 2013-08-31 DIAGNOSIS — D128 Benign neoplasm of rectum: Secondary | ICD-10-CM | POA: Diagnosis not present

## 2013-08-31 DIAGNOSIS — Z09 Encounter for follow-up examination after completed treatment for conditions other than malignant neoplasm: Secondary | ICD-10-CM | POA: Diagnosis not present

## 2013-12-06 DIAGNOSIS — R7309 Other abnormal glucose: Secondary | ICD-10-CM | POA: Diagnosis not present

## 2013-12-06 DIAGNOSIS — E669 Obesity, unspecified: Secondary | ICD-10-CM | POA: Diagnosis not present

## 2013-12-06 DIAGNOSIS — Z23 Encounter for immunization: Secondary | ICD-10-CM | POA: Diagnosis not present

## 2014-01-02 DIAGNOSIS — H43811 Vitreous degeneration, right eye: Secondary | ICD-10-CM | POA: Diagnosis not present

## 2014-02-02 DIAGNOSIS — H43813 Vitreous degeneration, bilateral: Secondary | ICD-10-CM | POA: Diagnosis not present

## 2014-03-06 DIAGNOSIS — M810 Age-related osteoporosis without current pathological fracture: Secondary | ICD-10-CM | POA: Diagnosis not present

## 2014-06-06 DIAGNOSIS — E559 Vitamin D deficiency, unspecified: Secondary | ICD-10-CM | POA: Diagnosis not present

## 2014-06-06 DIAGNOSIS — M8580 Other specified disorders of bone density and structure, unspecified site: Secondary | ICD-10-CM | POA: Diagnosis not present

## 2014-06-06 DIAGNOSIS — R7309 Other abnormal glucose: Secondary | ICD-10-CM | POA: Diagnosis not present

## 2014-06-06 DIAGNOSIS — K219 Gastro-esophageal reflux disease without esophagitis: Secondary | ICD-10-CM | POA: Diagnosis not present

## 2014-06-29 DIAGNOSIS — I872 Venous insufficiency (chronic) (peripheral): Secondary | ICD-10-CM | POA: Diagnosis not present

## 2014-06-29 DIAGNOSIS — L821 Other seborrheic keratosis: Secondary | ICD-10-CM | POA: Diagnosis not present

## 2014-06-29 DIAGNOSIS — I8311 Varicose veins of right lower extremity with inflammation: Secondary | ICD-10-CM | POA: Diagnosis not present

## 2014-06-29 DIAGNOSIS — I8312 Varicose veins of left lower extremity with inflammation: Secondary | ICD-10-CM | POA: Diagnosis not present

## 2014-07-19 DIAGNOSIS — Z23 Encounter for immunization: Secondary | ICD-10-CM | POA: Diagnosis not present

## 2014-07-19 DIAGNOSIS — E780 Pure hypercholesterolemia: Secondary | ICD-10-CM | POA: Diagnosis not present

## 2014-07-19 DIAGNOSIS — Z0000 Encounter for general adult medical examination without abnormal findings: Secondary | ICD-10-CM | POA: Diagnosis not present

## 2014-07-19 DIAGNOSIS — R739 Hyperglycemia, unspecified: Secondary | ICD-10-CM | POA: Diagnosis not present

## 2014-07-19 DIAGNOSIS — K219 Gastro-esophageal reflux disease without esophagitis: Secondary | ICD-10-CM | POA: Diagnosis not present

## 2014-07-25 DIAGNOSIS — M545 Low back pain: Secondary | ICD-10-CM | POA: Diagnosis not present

## 2014-07-25 DIAGNOSIS — I83893 Varicose veins of bilateral lower extremities with other complications: Secondary | ICD-10-CM | POA: Diagnosis not present

## 2014-07-25 DIAGNOSIS — K219 Gastro-esophageal reflux disease without esophagitis: Secondary | ICD-10-CM | POA: Diagnosis not present

## 2014-07-25 DIAGNOSIS — E780 Pure hypercholesterolemia: Secondary | ICD-10-CM | POA: Diagnosis not present

## 2015-01-24 DIAGNOSIS — Z23 Encounter for immunization: Secondary | ICD-10-CM | POA: Diagnosis not present

## 2015-07-17 DIAGNOSIS — J069 Acute upper respiratory infection, unspecified: Secondary | ICD-10-CM | POA: Diagnosis not present

## 2015-07-17 DIAGNOSIS — J029 Acute pharyngitis, unspecified: Secondary | ICD-10-CM | POA: Diagnosis not present

## 2015-07-22 DIAGNOSIS — L821 Other seborrheic keratosis: Secondary | ICD-10-CM | POA: Diagnosis not present

## 2015-07-22 DIAGNOSIS — I8312 Varicose veins of left lower extremity with inflammation: Secondary | ICD-10-CM | POA: Diagnosis not present

## 2015-07-22 DIAGNOSIS — I8311 Varicose veins of right lower extremity with inflammation: Secondary | ICD-10-CM | POA: Diagnosis not present

## 2015-07-22 DIAGNOSIS — I872 Venous insufficiency (chronic) (peripheral): Secondary | ICD-10-CM | POA: Diagnosis not present

## 2015-08-02 DIAGNOSIS — M8580 Other specified disorders of bone density and structure, unspecified site: Secondary | ICD-10-CM | POA: Diagnosis not present

## 2015-08-02 DIAGNOSIS — E559 Vitamin D deficiency, unspecified: Secondary | ICD-10-CM | POA: Diagnosis not present

## 2015-08-02 DIAGNOSIS — Z0000 Encounter for general adult medical examination without abnormal findings: Secondary | ICD-10-CM | POA: Diagnosis not present

## 2015-08-02 DIAGNOSIS — E7800 Pure hypercholesterolemia, unspecified: Secondary | ICD-10-CM | POA: Diagnosis not present

## 2015-08-02 DIAGNOSIS — K219 Gastro-esophageal reflux disease without esophagitis: Secondary | ICD-10-CM | POA: Diagnosis not present

## 2015-08-02 DIAGNOSIS — R739 Hyperglycemia, unspecified: Secondary | ICD-10-CM | POA: Diagnosis not present

## 2015-08-08 DIAGNOSIS — I340 Nonrheumatic mitral (valve) insufficiency: Secondary | ICD-10-CM | POA: Diagnosis not present

## 2015-08-08 DIAGNOSIS — I872 Venous insufficiency (chronic) (peripheral): Secondary | ICD-10-CM | POA: Diagnosis not present

## 2015-08-08 DIAGNOSIS — K219 Gastro-esophageal reflux disease without esophagitis: Secondary | ICD-10-CM | POA: Diagnosis not present

## 2015-08-08 DIAGNOSIS — E7800 Pure hypercholesterolemia, unspecified: Secondary | ICD-10-CM | POA: Diagnosis not present

## 2016-01-06 DIAGNOSIS — Z23 Encounter for immunization: Secondary | ICD-10-CM | POA: Diagnosis not present

## 2016-01-06 DIAGNOSIS — L304 Erythema intertrigo: Secondary | ICD-10-CM | POA: Diagnosis not present

## 2016-01-22 DIAGNOSIS — M2042 Other hammer toe(s) (acquired), left foot: Secondary | ICD-10-CM | POA: Diagnosis not present

## 2016-01-22 DIAGNOSIS — M79672 Pain in left foot: Secondary | ICD-10-CM | POA: Diagnosis not present

## 2016-01-22 DIAGNOSIS — L84 Corns and callosities: Secondary | ICD-10-CM | POA: Diagnosis not present

## 2016-03-30 DIAGNOSIS — R079 Chest pain, unspecified: Secondary | ICD-10-CM | POA: Diagnosis not present

## 2016-03-30 DIAGNOSIS — R0781 Pleurodynia: Secondary | ICD-10-CM | POA: Diagnosis not present

## 2016-03-30 DIAGNOSIS — M8588 Other specified disorders of bone density and structure, other site: Secondary | ICD-10-CM | POA: Diagnosis not present

## 2016-06-23 DIAGNOSIS — Z780 Asymptomatic menopausal state: Secondary | ICD-10-CM | POA: Diagnosis not present

## 2016-08-07 DIAGNOSIS — K219 Gastro-esophageal reflux disease without esophagitis: Secondary | ICD-10-CM | POA: Diagnosis not present

## 2016-08-07 DIAGNOSIS — Z0000 Encounter for general adult medical examination without abnormal findings: Secondary | ICD-10-CM | POA: Diagnosis not present

## 2016-08-07 DIAGNOSIS — E559 Vitamin D deficiency, unspecified: Secondary | ICD-10-CM | POA: Diagnosis not present

## 2016-08-07 DIAGNOSIS — M8580 Other specified disorders of bone density and structure, unspecified site: Secondary | ICD-10-CM | POA: Diagnosis not present

## 2016-08-07 DIAGNOSIS — E7800 Pure hypercholesterolemia, unspecified: Secondary | ICD-10-CM | POA: Diagnosis not present

## 2016-08-12 DIAGNOSIS — I872 Venous insufficiency (chronic) (peripheral): Secondary | ICD-10-CM | POA: Diagnosis not present

## 2016-08-12 DIAGNOSIS — E7800 Pure hypercholesterolemia, unspecified: Secondary | ICD-10-CM | POA: Diagnosis not present

## 2016-08-12 DIAGNOSIS — Z0001 Encounter for general adult medical examination with abnormal findings: Secondary | ICD-10-CM | POA: Diagnosis not present

## 2016-08-12 DIAGNOSIS — K219 Gastro-esophageal reflux disease without esophagitis: Secondary | ICD-10-CM | POA: Diagnosis not present

## 2016-10-13 ENCOUNTER — Encounter: Payer: Self-pay | Admitting: Obstetrics & Gynecology

## 2016-10-13 ENCOUNTER — Ambulatory Visit (INDEPENDENT_AMBULATORY_CARE_PROVIDER_SITE_OTHER): Payer: Medicare HMO | Admitting: Obstetrics & Gynecology

## 2016-10-13 VITALS — BP 128/80 | Ht 69.0 in | Wt 215.0 lb

## 2016-10-13 DIAGNOSIS — L292 Pruritus vulvae: Secondary | ICD-10-CM

## 2016-10-13 DIAGNOSIS — R69 Illness, unspecified: Secondary | ICD-10-CM | POA: Diagnosis not present

## 2016-10-13 DIAGNOSIS — E3451 Complete androgen insensitivity syndrome: Secondary | ICD-10-CM | POA: Diagnosis not present

## 2016-10-13 DIAGNOSIS — Z01411 Encounter for gynecological examination (general) (routine) with abnormal findings: Secondary | ICD-10-CM | POA: Diagnosis not present

## 2016-10-13 DIAGNOSIS — Z1151 Encounter for screening for human papillomavirus (HPV): Secondary | ICD-10-CM | POA: Diagnosis not present

## 2016-10-13 DIAGNOSIS — R87620 Atypical squamous cells of undetermined significance on cytologic smear of vagina (ASC-US): Secondary | ICD-10-CM | POA: Diagnosis not present

## 2016-10-13 LAB — WET PREP FOR TRICH, YEAST, CLUE
Clue Cells Wet Prep HPF POC: NONE SEEN
Trich, Wet Prep: NONE SEEN
WBC, Wet Prep HPF POC: NONE SEEN
Yeast Wet Prep HPF POC: NONE SEEN

## 2016-10-13 MED ORDER — CLOBETASOL PROPIONATE 0.05 % EX OINT: 1.0000 "application " | TOPICAL_OINTMENT | Freq: Every day | CUTANEOUS | 0 refills | Status: AC

## 2016-10-13 NOTE — Progress Notes (Addendum)
[place] 18-Jan-1946 [id number]   History:    71 y.o.  G0  Engaged.  Recently retired Pharmacist, hospital.  Adopted daughter is 49 yo.  Patient is going to [place] with fiance in 2 days!  RP:  for annual gyn exam   HPI:  Complete Androgen Insensitivity Syndrome.  Removal of gonads many years ago.  On HRT x 10-15 years per patient.  C/O mild itching posterior vulva on each side.  Sexually active, no problem.  No pelvic pain.  Breasts wnl.   Past medical history,surgical history, family history and social history were all reviewed and documented in the EPIC chart.  Gynecologic History No LMP recorded. Patient has had a hysterectomy. Contraception: No uterus Last Pap: Many years ago. Results were: normal per patient Last mammogram: Not up to date. Bone Density Osteopenia 2017 Colonoscopy 2012  Obstetric History OB History  Gravida Para Term Preterm AB Living  0 0 0 0 0 0  SAB TAB Ectopic Multiple Live Births  0 0 0 0 0      Obstetric Comments  1 adopted daughter      ROS: A ROS was performed and pertinent positives and negatives are included in the history.  GENERAL: No fevers or chills. HEENT: No change in vision, no earache, sore throat or sinus congestion. NECK: No pain or stiffness. CARDIOVASCULAR: No chest pain or pressure. No palpitations. PULMONARY: No shortness of breath, cough or wheeze. GASTROINTESTINAL: No abdominal pain, nausea, vomiting or diarrhea, melena or bright red blood per rectum. GENITOURINARY: No urinary frequency, urgency, hesitancy or dysuria. MUSCULOSKELETAL: No joint or muscle pain, no back pain, no recent trauma. DERMATOLOGIC: No rash, no itching, no lesions. ENDOCRINE: No polyuria, polydipsia, no heat or cold intolerance. No recent change in weight. HEMATOLOGICAL: No anemia or easy bruising or bleeding. NEUROLOGIC: No headache, seizures, numbness, tingling or weakness. PSYCHIATRIC: No depression, no loss of interest in normal activity or change in sleep pattern.       Exam:andro   BP 128/80   Ht 5\' 9"  (1.753 m)   Wt 215 lb (97.5 kg)   BMI 31.75 kg/m   Body mass index is 31.75 kg/m.  General appearance : Well developed well nourished female. No acute distress HEENT: Eyes: no retinal hemorrhage or exudates,  Neck supple, trachea midline, no carotid bruits, no thyroidmegaly Lungs: Clear to auscultation, no rhonchi or wheezes, or rib retractions  Heart: Regular rate and rhythm, no murmurs or gallops Breast:Examined in sitting and supine position were symmetrical in appearance, no palpable masses or tenderness,  no skin retraction, no nipple inversion, no nipple discharge, no skin discoloration, no axillary or supraclavicular lymphadenopathy Abdomen: no palpable masses or tenderness, no rebound or guarding Extremities: no edema or skin discoloration or tenderness  Pelvic: Vulva:  Normal except mild folliculitis post vulva which corresponds to area of pruritus.  Bartholin, Urethra, Skene Glands: Within normal limits             Vagina: No gross lesions or discharge.  Short.  Wet prep done.  Cervix/Uterus Absent  Adnexa  Without masses or tenderness  Anus and perineum  normal    Assessment/Plan:  71 y.o. female for annual exam   1. Encounter for gynecological examination with abnormal finding Gyn exam in patient with CAIS.  Pap/HPV HR done.  Breast exam normal.  Recommend screening Mammo.  Vit D supplements and Ca++ in nutrition.  2. Vulvar itching Wet prep negative.  Clobetasol 0.05% ointment prescribed.  Thin  vulvar application once a day x 1 week.  If still symptomatic, can wean over 2 additional weeks.  - WET PREP FOR [place], YEAST, CLUE  Counseling on above issues >50% x 10 minutes  Casey Bruins MD, 9:46 AM 10/13/2016

## 2016-10-13 NOTE — Patient Instructions (Addendum)
1. Encounter for gynecological examination with abnormal finding Gyn exam in patient with CAIS.  Pap/HPV HR done.  Breast exam normal.  Recommend screening Mammo.  Vit D supplements and Ca++ in nutrition.  2. Vulvar itching Wet prep negative.  Clobetasol 0.05% ointment prescribed.  Thin vulvar application once a day x 1 week.  If still symptomatic, can wean over 2 additional weeks.  - WET PREP FOR [place], YEAST, CLUE  [place], it was a pleasure to meet you today!  Please schedule your screening Mammo as soon as possible.  [facility]:  [phone].  I will inform you of your results as soon as available.

## 2016-10-13 NOTE — Addendum Note (Signed)
Addended by: Thurnell Garbe A on: 10/13/2016 10:46 AM   Modules accepted: Orders

## 2016-10-15 LAB — PAP, TP IMAGING W/ HPV RNA, RFLX HPV TYPE 16,18/45: HPV mRNA, High Risk: NOT DETECTED

## 2016-11-30 ENCOUNTER — Encounter: Payer: Self-pay | Admitting: Obstetrics & Gynecology

## 2016-11-30 ENCOUNTER — Ambulatory Visit (INDEPENDENT_AMBULATORY_CARE_PROVIDER_SITE_OTHER): Payer: Medicare HMO | Admitting: Obstetrics & Gynecology

## 2016-11-30 VITALS — BP 140/86

## 2016-11-30 DIAGNOSIS — L292 Pruritus vulvae: Secondary | ICD-10-CM | POA: Diagnosis not present

## 2016-11-30 LAB — WET PREP FOR TRICH, YEAST, CLUE

## 2016-11-30 NOTE — Patient Instructions (Signed)
1. Vulvar itching Will start back on Triamcinolone 0.025% cream, every day x 3 weeks, then weaning over 3 months.  Probiotic vaginally for prevention.   - WET PREP FOR [place], YEAST, CLUE:  Negative  [place], it was a pleasure to see you today!

## 2016-11-30 NOTE — Progress Notes (Signed)
    [place] 1945/08/17 [id number]        71 y.o.  G0 Traveled to [place]  RP:  Vulvar itching  HPI:  Better when on Triamcinolone, but Sxs of vulvar itching recurred when stopped. No vaginal d/c.  No pelvic pain. No fever.  Past medical history,surgical history, problem list, medications, allergies, family history and social history were all reviewed and documented in the EPIC chart.  Directed ROS with pertinent positives and negatives documented in the history of present illness/assessment and plan.  Exam:  Vitals:   11/30/16 1136  BP: 140/86   General appearance:  Normal  Gyn exam:  Vulva Mild erythema.  No lesion.                     Vagina Normal secretions.  Wet prep done.  Assessment/Plan:  71 y.o. G0   1. Vulvar itching Will start back on Triamcinolone 0.025% cream, every day x 3 weeks, then weaning over 3 months.  Probiotic vaginally for prevention.   - WET PREP FOR TRICH, YEAST, CLUE:  Negative  Counseling on above issue >50% x 15 minutes.  Princess Bruins MD, 11:50 AM 11/30/2016

## 2016-12-29 DIAGNOSIS — Z23 Encounter for immunization: Secondary | ICD-10-CM | POA: Diagnosis not present

## 2017-01-21 DIAGNOSIS — L299 Pruritus, unspecified: Secondary | ICD-10-CM | POA: Diagnosis not present

## 2017-01-21 DIAGNOSIS — K219 Gastro-esophageal reflux disease without esophagitis: Secondary | ICD-10-CM | POA: Diagnosis not present

## 2017-01-21 DIAGNOSIS — R109 Unspecified abdominal pain: Secondary | ICD-10-CM | POA: Diagnosis not present

## 2017-01-21 DIAGNOSIS — R102 Pelvic and perineal pain: Secondary | ICD-10-CM | POA: Diagnosis not present

## 2017-01-26 ENCOUNTER — Other Ambulatory Visit: Payer: Self-pay | Admitting: Internal Medicine

## 2017-01-26 DIAGNOSIS — Z1231 Encounter for screening mammogram for malignant neoplasm of breast: Secondary | ICD-10-CM

## 2017-01-27 ENCOUNTER — Other Ambulatory Visit: Payer: Self-pay | Admitting: Internal Medicine

## 2017-01-27 DIAGNOSIS — R102 Pelvic and perineal pain: Secondary | ICD-10-CM

## 2017-01-29 ENCOUNTER — Other Ambulatory Visit: Payer: Self-pay | Admitting: Internal Medicine

## 2017-01-29 ENCOUNTER — Ambulatory Visit
Admission: RE | Admit: 2017-01-29 | Discharge: 2017-01-29 | Disposition: A | Discharge status: Home / Self Care | Payer: BC Managed Care – PPO | Source: Ambulatory Visit | Attending: Internal Medicine | Admitting: Internal Medicine

## 2017-01-29 DIAGNOSIS — R102 Pelvic and perineal pain: Secondary | ICD-10-CM

## 2017-02-02 DIAGNOSIS — L308 Other specified dermatitis: Secondary | ICD-10-CM | POA: Diagnosis not present

## 2017-02-18 DIAGNOSIS — L299 Pruritus, unspecified: Secondary | ICD-10-CM | POA: Diagnosis not present

## 2017-02-18 DIAGNOSIS — R109 Unspecified abdominal pain: Secondary | ICD-10-CM | POA: Diagnosis not present

## 2017-02-18 DIAGNOSIS — R102 Pelvic and perineal pain: Secondary | ICD-10-CM | POA: Diagnosis not present

## 2017-02-18 DIAGNOSIS — K219 Gastro-esophageal reflux disease without esophagitis: Secondary | ICD-10-CM | POA: Diagnosis not present

## 2017-02-25 ENCOUNTER — Ambulatory Visit
Admission: RE | Admit: 2017-02-25 | Discharge: 2017-02-25 | Disposition: A | Discharge status: Home / Self Care | Payer: Medicare HMO | Source: Ambulatory Visit | Attending: Internal Medicine | Admitting: Internal Medicine

## 2017-02-25 DIAGNOSIS — Z1231 Encounter for screening mammogram for malignant neoplasm of breast: Secondary | ICD-10-CM | POA: Diagnosis not present

## 2017-02-26 ENCOUNTER — Other Ambulatory Visit: Payer: Self-pay | Admitting: Internal Medicine

## 2017-02-26 DIAGNOSIS — R928 Other abnormal and inconclusive findings on diagnostic imaging of breast: Secondary | ICD-10-CM

## 2017-03-03 ENCOUNTER — Ambulatory Visit
Admission: RE | Admit: 2017-03-03 | Discharge: 2017-03-03 | Disposition: A | Discharge status: Home / Self Care | Payer: Medicare HMO | Source: Ambulatory Visit | Attending: Internal Medicine | Admitting: Internal Medicine

## 2017-03-03 ENCOUNTER — Other Ambulatory Visit: Payer: Self-pay | Admitting: Internal Medicine

## 2017-03-03 ENCOUNTER — Other Ambulatory Visit: Payer: Medicare HMO

## 2017-03-03 DIAGNOSIS — N6489 Other specified disorders of breast: Secondary | ICD-10-CM

## 2017-03-03 DIAGNOSIS — R928 Other abnormal and inconclusive findings on diagnostic imaging of breast: Secondary | ICD-10-CM | POA: Diagnosis not present

## 2017-03-03 DIAGNOSIS — N651 Disproportion of reconstructed breast: Secondary | ICD-10-CM | POA: Diagnosis not present

## 2017-05-13 DIAGNOSIS — J019 Acute sinusitis, unspecified: Secondary | ICD-10-CM | POA: Diagnosis not present

## 2017-06-21 DIAGNOSIS — L304 Erythema intertrigo: Secondary | ICD-10-CM | POA: Diagnosis not present

## 2017-08-05 DIAGNOSIS — J111 Influenza due to unidentified influenza virus with other respiratory manifestations: Secondary | ICD-10-CM | POA: Diagnosis not present

## 2017-08-12 DIAGNOSIS — E7800 Pure hypercholesterolemia, unspecified: Secondary | ICD-10-CM | POA: Diagnosis not present

## 2017-08-12 DIAGNOSIS — M8580 Other specified disorders of bone density and structure, unspecified site: Secondary | ICD-10-CM | POA: Diagnosis not present

## 2017-08-12 DIAGNOSIS — E559 Vitamin D deficiency, unspecified: Secondary | ICD-10-CM | POA: Diagnosis not present

## 2017-08-12 DIAGNOSIS — K219 Gastro-esophageal reflux disease without esophagitis: Secondary | ICD-10-CM | POA: Diagnosis not present

## 2017-08-25 DIAGNOSIS — Z23 Encounter for immunization: Secondary | ICD-10-CM | POA: Diagnosis not present

## 2017-08-25 DIAGNOSIS — I83893 Varicose veins of bilateral lower extremities with other complications: Secondary | ICD-10-CM | POA: Diagnosis not present

## 2017-08-25 DIAGNOSIS — R7309 Other abnormal glucose: Secondary | ICD-10-CM | POA: Diagnosis not present

## 2017-08-25 DIAGNOSIS — E7800 Pure hypercholesterolemia, unspecified: Secondary | ICD-10-CM | POA: Diagnosis not present

## 2017-08-25 DIAGNOSIS — Z6831 Body mass index (BMI) 31.0-31.9, adult: Secondary | ICD-10-CM | POA: Diagnosis not present

## 2017-08-25 DIAGNOSIS — K219 Gastro-esophageal reflux disease without esophagitis: Secondary | ICD-10-CM | POA: Diagnosis not present

## 2017-08-25 DIAGNOSIS — Z09 Encounter for follow-up examination after completed treatment for conditions other than malignant neoplasm: Secondary | ICD-10-CM | POA: Diagnosis not present

## 2017-08-25 DIAGNOSIS — I872 Venous insufficiency (chronic) (peripheral): Secondary | ICD-10-CM | POA: Diagnosis not present

## 2017-08-25 DIAGNOSIS — M8580 Other specified disorders of bone density and structure, unspecified site: Secondary | ICD-10-CM | POA: Diagnosis not present

## 2017-09-27 ENCOUNTER — Encounter: Payer: Self-pay | Admitting: Obstetrics & Gynecology

## 2017-09-27 ENCOUNTER — Ambulatory Visit (INDEPENDENT_AMBULATORY_CARE_PROVIDER_SITE_OTHER): Payer: Medicare HMO | Admitting: Obstetrics & Gynecology

## 2017-09-27 VITALS — BP 146/84

## 2017-09-27 DIAGNOSIS — N952 Postmenopausal atrophic vaginitis: Secondary | ICD-10-CM

## 2017-09-27 DIAGNOSIS — L292 Pruritus vulvae: Secondary | ICD-10-CM

## 2017-09-27 LAB — WET PREP FOR TRICH, YEAST, CLUE

## 2017-09-27 MED ORDER — ESTRADIOL 0.1 MG/GM VA CREA: 0.2500 | TOPICAL_CREAM | VAGINAL | 4 refills | Status: DC

## 2017-09-27 MED ORDER — CLOBETASOL PROPIONATE 0.05 % EX CREA: 1.0000 "application " | TOPICAL_CREAM | Freq: Every day | CUTANEOUS | 1 refills | Status: AC

## 2017-09-27 NOTE — Progress Notes (Signed)
    [place] 02-04-1946 [id number]        72 y.o.  [place].  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  RP: Vulvar itching  HPI: Patient with CAIS.  Recurrence of vulvar itching after stopping the clobetasol cream.  Also experiencing vaginal dryness and discomfort.  No pelvic pain.  No urinary tract infection symptoms.  Bowel movements normal.  No fever.   OB History  Gravida Para Term Preterm AB Living  0 0 0 0 0 0  SAB TAB Ectopic Multiple Live Births  0 0 0 0 0  Obstetric Comments  1 adopted daughter     Past medical history,surgical history, problem list, medications, allergies, family history and social history were all reviewed and documented in the EPIC chart.   Directed ROS with pertinent positives and negatives documented in the history of present illness/assessment and plan.  Exam:  Vitals:   09/27/17 1454  BP: (!) 146/84   General appearance:  Normal   Gynecologic exam: Mild skin depigmentation at bilateral external large labia.  No discrete vulvar lesion.  No erythema.  Very mild vaginal secretions.  Wet prep done in vagina.  Wet prep neg   Assessment/Plan:  72 y.o. G0P0000   1. Vulvar itching Wet prep negative.  Patient reassured.  Will restart on clobetasol cream daily for 2 weeks for vulvitis given that she was helped with that treatment in the past. - WET PREP FOR TRICH, YEAST, CLUE  2. Post-menopausal/CAIS atrophic vaginitis Acute vulvitis probably made worse given atrophic vaginitis of menopause.  Decision to start on estradiol cream will use a quarter of an applicator vaginally twice a week and put a little bit on the vulva as needed.  Other orders - clobetasol cream (TEMOVATE) 0.05 %; Apply 1 application topically daily for 14 days. - estradiol (ESTRACE) 0.1 MG/GM vaginal cream; Place 9.93 Applicatorfuls vaginally 2 (two) times a week.  Counseling on above issues and coordination of care more than 50% for 15 minutes.  Princess Bruins  MD, 3:35 PM 09/27/2017

## 2017-09-29 ENCOUNTER — Encounter: Payer: Self-pay | Admitting: Obstetrics & Gynecology

## 2017-09-29 NOTE — Patient Instructions (Addendum)
1. Vulvar itching Wet prep negative.  Patient reassured.  Will restart on clobetasol cream daily for 2 weeks for vulvitis given that she was helped with that treatment in the past. - WET PREP FOR TRICH, YEAST, CLUE  2. Post-menopausal/CAIS atrophic vaginitis Acute vulvitis probably made worse given atrophic vaginitis of menopause.  Decision to start on estradiol cream will use a quarter of an applicator vaginally twice a week and put a little bit on the vulva as needed.  Other orders - clobetasol cream (TEMOVATE) 0.05 %; Apply 1 application topically daily for 14 days. - estradiol (ESTRACE) 0.1 MG/GM vaginal cream; Place 0.17 Applicatorfuls vaginally 2 (two) times a week.  Kirandeep, it was a pleasure seeing you today!

## 2017-10-18 DIAGNOSIS — I8391 Asymptomatic varicose veins of right lower extremity: Secondary | ICD-10-CM | POA: Diagnosis not present

## 2017-10-20 DIAGNOSIS — M1711 Unilateral primary osteoarthritis, right knee: Secondary | ICD-10-CM | POA: Diagnosis not present

## 2017-10-20 DIAGNOSIS — M1712 Unilateral primary osteoarthritis, left knee: Secondary | ICD-10-CM | POA: Diagnosis not present

## 2017-10-20 DIAGNOSIS — M25562 Pain in left knee: Secondary | ICD-10-CM | POA: Diagnosis not present

## 2017-10-21 DIAGNOSIS — M064 Inflammatory polyarthropathy: Secondary | ICD-10-CM | POA: Diagnosis not present

## 2017-10-21 DIAGNOSIS — M25561 Pain in right knee: Secondary | ICD-10-CM | POA: Diagnosis not present

## 2017-10-21 DIAGNOSIS — M1990 Unspecified osteoarthritis, unspecified site: Secondary | ICD-10-CM | POA: Diagnosis not present

## 2017-10-21 DIAGNOSIS — M25462 Effusion, left knee: Secondary | ICD-10-CM | POA: Diagnosis not present

## 2017-11-17 DIAGNOSIS — M25462 Effusion, left knee: Secondary | ICD-10-CM | POA: Diagnosis not present

## 2017-11-17 DIAGNOSIS — M1990 Unspecified osteoarthritis, unspecified site: Secondary | ICD-10-CM | POA: Diagnosis not present

## 2017-11-17 DIAGNOSIS — M25561 Pain in right knee: Secondary | ICD-10-CM | POA: Diagnosis not present

## 2017-12-10 DIAGNOSIS — Z23 Encounter for immunization: Secondary | ICD-10-CM | POA: Diagnosis not present

## 2018-01-05 ENCOUNTER — Encounter: Payer: Self-pay | Admitting: Obstetrics & Gynecology

## 2018-01-05 ENCOUNTER — Ambulatory Visit (INDEPENDENT_AMBULATORY_CARE_PROVIDER_SITE_OTHER): Payer: Medicare HMO | Admitting: Obstetrics & Gynecology

## 2018-01-05 VITALS — BP 150/76 | Ht 69.5 in | Wt 210.0 lb

## 2018-01-05 DIAGNOSIS — R87622 Low grade squamous intraepithelial lesion on cytologic smear of vagina (LGSIL): Secondary | ICD-10-CM | POA: Diagnosis not present

## 2018-01-05 DIAGNOSIS — Z9289 Personal history of other medical treatment: Secondary | ICD-10-CM | POA: Diagnosis not present

## 2018-01-05 DIAGNOSIS — Z01419 Encounter for gynecological examination (general) (routine) without abnormal findings: Secondary | ICD-10-CM

## 2018-01-05 DIAGNOSIS — Z9189 Other specified personal risk factors, not elsewhere classified: Secondary | ICD-10-CM | POA: Diagnosis not present

## 2018-01-05 DIAGNOSIS — L292 Pruritus vulvae: Secondary | ICD-10-CM

## 2018-01-05 DIAGNOSIS — Z1272 Encounter for screening for malignant neoplasm of vagina: Secondary | ICD-10-CM | POA: Diagnosis not present

## 2018-01-05 MED ORDER — CLOBETASOL PROPIONATE 0.05 % EX CREA: 1.0000 "application " | TOPICAL_CREAM | Freq: Every day | CUTANEOUS | 3 refills | Status: DC

## 2018-01-05 NOTE — Patient Instructions (Signed)
1. Encounter for Papanicolaou smear of vagina as part of routine gynecological examination Patient with CAIS.  Pap test ASCUS/HPV HR neg 09/2016.  Pap reflex repeated today.  Breasts wnl.  Screening Mammo 02/2018, recommend 3D.  Colonoscopy 2 1/2 yrs ago.  Bone Density scheduled.  Vit D supplements/Ca++ 1.5 g/d, weight bearing physical activity.  [facility] with Dr Shelia Media.  2. Vulvar itching Vulvar exam normal today.  Recommend not to use any soap on vulva and no vaginal douching.  Clobetasol, a thin layer on vulva as needed.  Prescription sent to pharmacy.  Other orders - clobetasol cream (TEMOVATE) 0.05 %; Apply 1 application topically daily. Thin layer on vulva  Aralyn, it was a pleasure seeing you today!  I will inform you of your results as soon as they are available.

## 2018-01-05 NOTE — Progress Notes (Signed)
[place] [birth year] [id number]   History:    72 y.o. G0 Engaged  RP:  Established patient presenting for annual gyn exam   HPI: CAIS.  No pelvic pain.  Continued vulvar itching, improved with Clobetasol ointment.  Urine/BMs wnl.  Breasts wnl.  BMI 30.57.  [facility] with Dr Shelia Media.  Past medical history,surgical history, family history and social history were all reviewed and documented in the EPIC chart.  Gynecologic History No LMP recorded. Patient has had a hysterectomy. Contraception: No uterus Last Pap: 09/2016. Results were: ASCUS/HPV HR negative Last mammogram: 02/2017. Results were: Left Negative.  Right Dx Mammo/US Probably Benign Bone Density: Scheduled by Dr Shelia Media Colonoscopy: 2 1/2 yrs ago  Obstetric History OB History  Gravida Para Term Preterm AB Living  0 0 0 0 0 0  SAB TAB Ectopic Multiple Live Births  0 0 0 0 0  Obstetric Comments  1 adopted daughter      ROS: A ROS was performed and pertinent positives and negatives are included in the history.  GENERAL: No fevers or chills. HEENT: No change in vision, no earache, sore throat or sinus congestion. NECK: No pain or stiffness. CARDIOVASCULAR: No chest pain or pressure. No palpitations. PULMONARY: No shortness of breath, cough or wheeze. GASTROINTESTINAL: No abdominal pain, nausea, vomiting or diarrhea, melena or bright red blood per rectum. GENITOURINARY: No urinary frequency, urgency, hesitancy or dysuria. MUSCULOSKELETAL: No joint or muscle pain, no back pain, no recent trauma. DERMATOLOGIC: No rash, no itching, no lesions. ENDOCRINE: No polyuria, polydipsia, no heat or cold intolerance. No recent change in weight. HEMATOLOGICAL: No anemia or easy bruising or bleeding. NEUROLOGIC: No headache, seizures, numbness, tingling or weakness. PSYCHIATRIC: No depression, no loss of interest in normal activity or change in sleep pattern.     Exam:   BP (!) 150/76   Ht 5' 9.5" (1.765 m)   Wt 210 lb (95.3 kg)    BMI 30.57 kg/m   Body mass index is 30.57 kg/m.  General appearance : Well developed well nourished female. No acute distress HEENT: Eyes: no retinal hemorrhage or exudates,  Neck supple, trachea midline, no carotid bruits, no thyroidmegaly Lungs: Clear to auscultation, no rhonchi or wheezes, or rib retractions  Heart: Regular rate and rhythm, no murmurs or gallops Breast:Examined in sitting and supine position were symmetrical in appearance, no palpable masses or tenderness,  no skin retraction, no nipple inversion, no nipple discharge, no skin discoloration, no axillary or supraclavicular lymphadenopathy Abdomen: no palpable masses or tenderness, no rebound or guarding Extremities: no edema or skin discoloration or tenderness  Pelvic: Vulva: Normal             Vagina: Short.  No gross lesions or discharge.  Pap reflex done.  Cervix/Absent  Adnexa  Without masses or tenderness  Anus: Normal   Assessment/Plan:  72 y.o. female for annual exam   1. Encounter for Papanicolaou smear of vagina as part of routine gynecological examination Patient with CAIS.  Pap test ASCUS/HPV HR neg 09/2016.  Pap reflex repeated today.  Breasts wnl.  Screening Mammo 02/2018, recommend 3D.  Colonoscopy 2 1/2 yrs ago.  Bone Density scheduled.  Vit D supplements/Ca++ 1.5 g/d, weight bearing physical activity.  [facility] with Dr Shelia Media.  2. Vulvar itching Vulvar exam normal today.  Recommend not to use any soap on vulva and no vaginal douching.  Clobetasol, a thin layer on vulva as needed.  Prescription sent to pharmacy.  Other orders -  clobetasol cream (TEMOVATE) 0.05 %; Apply 1 application topically daily. Thin layer on vulva  Princess Bruins MD, 3:05 PM 01/05/2018

## 2018-01-05 NOTE — Addendum Note (Signed)
Addended by: Thurnell Garbe A on: 01/05/2018 04:03 PM   Modules accepted: Orders

## 2018-01-06 LAB — PAP IG W/ RFLX HPV ASCU

## 2018-03-01 DIAGNOSIS — M25561 Pain in right knee: Secondary | ICD-10-CM | POA: Diagnosis not present

## 2018-03-01 DIAGNOSIS — M1990 Unspecified osteoarthritis, unspecified site: Secondary | ICD-10-CM | POA: Diagnosis not present

## 2018-03-01 DIAGNOSIS — M25462 Effusion, left knee: Secondary | ICD-10-CM | POA: Diagnosis not present

## 2018-04-12 ENCOUNTER — Other Ambulatory Visit: Payer: Self-pay | Admitting: Family Medicine

## 2018-04-12 DIAGNOSIS — M79606 Pain in leg, unspecified: Secondary | ICD-10-CM | POA: Diagnosis not present

## 2018-04-12 DIAGNOSIS — M7989 Other specified soft tissue disorders: Secondary | ICD-10-CM

## 2018-04-12 DIAGNOSIS — R238 Other skin changes: Secondary | ICD-10-CM

## 2018-04-12 DIAGNOSIS — I872 Venous insufficiency (chronic) (peripheral): Secondary | ICD-10-CM | POA: Diagnosis not present

## 2018-04-12 DIAGNOSIS — M79605 Pain in left leg: Secondary | ICD-10-CM

## 2018-04-13 ENCOUNTER — Other Ambulatory Visit: Payer: Medicare HMO

## 2018-04-13 ENCOUNTER — Ambulatory Visit
Admission: RE | Admit: 2018-04-13 | Discharge: 2018-04-13 | Disposition: A | Discharge status: Home / Self Care | Payer: Medicare HMO | Source: Ambulatory Visit | Attending: Family Medicine | Admitting: Family Medicine

## 2018-04-13 DIAGNOSIS — M7989 Other specified soft tissue disorders: Secondary | ICD-10-CM

## 2018-04-13 DIAGNOSIS — Z86718 Personal history of other venous thrombosis and embolism: Secondary | ICD-10-CM | POA: Diagnosis not present

## 2018-04-13 DIAGNOSIS — R238 Other skin changes: Secondary | ICD-10-CM

## 2018-04-13 DIAGNOSIS — M79605 Pain in left leg: Secondary | ICD-10-CM

## 2018-04-18 DIAGNOSIS — I83813 Varicose veins of bilateral lower extremities with pain: Secondary | ICD-10-CM | POA: Diagnosis not present

## 2018-04-18 DIAGNOSIS — I8312 Varicose veins of left lower extremity with inflammation: Secondary | ICD-10-CM | POA: Diagnosis not present

## 2018-04-18 DIAGNOSIS — I8311 Varicose veins of right lower extremity with inflammation: Secondary | ICD-10-CM | POA: Diagnosis not present

## 2018-04-18 DIAGNOSIS — I83893 Varicose veins of bilateral lower extremities with other complications: Secondary | ICD-10-CM | POA: Diagnosis not present

## 2018-04-22 DIAGNOSIS — R1310 Dysphagia, unspecified: Secondary | ICD-10-CM | POA: Diagnosis not present

## 2018-04-22 DIAGNOSIS — Z86010 Personal history of colonic polyps: Secondary | ICD-10-CM | POA: Diagnosis not present

## 2018-04-22 DIAGNOSIS — K219 Gastro-esophageal reflux disease without esophagitis: Secondary | ICD-10-CM | POA: Diagnosis not present

## 2018-04-27 DIAGNOSIS — I8311 Varicose veins of right lower extremity with inflammation: Secondary | ICD-10-CM | POA: Diagnosis not present

## 2018-04-27 DIAGNOSIS — I83893 Varicose veins of bilateral lower extremities with other complications: Secondary | ICD-10-CM | POA: Diagnosis not present

## 2018-04-27 DIAGNOSIS — I8312 Varicose veins of left lower extremity with inflammation: Secondary | ICD-10-CM | POA: Diagnosis not present

## 2018-04-27 DIAGNOSIS — I83813 Varicose veins of bilateral lower extremities with pain: Secondary | ICD-10-CM | POA: Diagnosis not present

## 2018-05-02 DIAGNOSIS — I8311 Varicose veins of right lower extremity with inflammation: Secondary | ICD-10-CM | POA: Diagnosis not present

## 2018-05-02 DIAGNOSIS — I8002 Phlebitis and thrombophlebitis of superficial vessels of left lower extremity: Secondary | ICD-10-CM | POA: Diagnosis not present

## 2018-05-02 DIAGNOSIS — I83813 Varicose veins of bilateral lower extremities with pain: Secondary | ICD-10-CM | POA: Diagnosis not present

## 2018-05-02 DIAGNOSIS — I8312 Varicose veins of left lower extremity with inflammation: Secondary | ICD-10-CM | POA: Diagnosis not present

## 2018-05-04 ENCOUNTER — Encounter: Payer: Self-pay | Admitting: Obstetrics & Gynecology

## 2018-05-04 ENCOUNTER — Ambulatory Visit (INDEPENDENT_AMBULATORY_CARE_PROVIDER_SITE_OTHER): Payer: Medicare HMO | Admitting: Obstetrics & Gynecology

## 2018-05-04 VITALS — BP 140/84

## 2018-05-04 DIAGNOSIS — Z1151 Encounter for screening for human papillomavirus (HPV): Secondary | ICD-10-CM

## 2018-05-04 DIAGNOSIS — R87620 Atypical squamous cells of undetermined significance on cytologic smear of vagina (ASC-US): Secondary | ICD-10-CM | POA: Diagnosis not present

## 2018-05-04 DIAGNOSIS — R69 Illness, unspecified: Secondary | ICD-10-CM | POA: Diagnosis not present

## 2018-05-04 DIAGNOSIS — R87622 Low grade squamous intraepithelial lesion on cytologic smear of vagina (LGSIL): Secondary | ICD-10-CM | POA: Diagnosis not present

## 2018-05-04 NOTE — Patient Instructions (Signed)
1. 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  Previous Pap was showing ASCUS with negative high-risk HPV July 2018.  20-month repeat Pap with repeat high risk HPV today.  Management per results.  Aftan, it was a pleasure seeing you today!  I will inform you of your results as soon as they are available.

## 2018-05-04 NOTE — Progress Notes (Signed)
    [place] 10-May-1945 [id number]        72 y.o.  G0 Engaged  RP: LGSIL on Pap 12/2017 for Repeat Pap/HPV HR  HPI: Pap of vagina 09/2016 ASCUS/HPV HR neg.  No abnormal vaginal discharge.  Vulvar itching on Clobetasol ointment.   OB History  Gravida Para Term Preterm AB Living  0 0 0 0 0 0  SAB TAB Ectopic Multiple Live Births  0 0 0 0 0  Obstetric Comments  1 adopted daughter     Past medical history,surgical history, problem list, medications, allergies, family history and social history were all reviewed and documented in the EPIC chart.   Directed ROS with pertinent positives and negatives documented in the history of present illness/assessment and plan.  Exam:  Vitals:   05/04/18 1413  BP: 140/84   General appearance:  Normal  Gynecologic exam: Vulva normal.  Speculum:  Vagina short, otherwise normal.  Normal secretions.  Pap test/HPV HR done at vaginal vault.     Assessment/Plan:  73 y.o. G0  1. 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  Previous Pap was showing ASCUS with negative high-risk HPV July 2018.  9-month repeat Pap with repeat high risk HPV today.  Management per results.  Counseling on above issues and coordination of care more than 50% for 15 minutes.  Princess Bruins MD, 2:26 PM 05/04/2018

## 2018-05-04 NOTE — Addendum Note (Signed)
Addended by: Thurnell Garbe A on: 05/04/2018 03:06 PM   Modules accepted: Orders

## 2018-05-12 LAB — PAP, TP IMAGING W/ HPV RNA, RFLX HPV TYPE 16,18/45: HPV DNA High Risk: DETECTED — AB

## 2018-05-12 LAB — HPV TYPE 16 AND 18/45 RNA
HPV Type 16 RNA: NOT DETECTED
HPV Type 18/45 RNA: NOT DETECTED

## 2018-05-19 DIAGNOSIS — K222 Esophageal obstruction: Secondary | ICD-10-CM | POA: Diagnosis not present

## 2018-05-19 DIAGNOSIS — K449 Diaphragmatic hernia without obstruction or gangrene: Secondary | ICD-10-CM | POA: Diagnosis not present

## 2018-05-19 DIAGNOSIS — K219 Gastro-esophageal reflux disease without esophagitis: Secondary | ICD-10-CM | POA: Diagnosis not present

## 2018-05-19 DIAGNOSIS — R1310 Dysphagia, unspecified: Secondary | ICD-10-CM | POA: Diagnosis not present

## 2018-06-13 DIAGNOSIS — I8002 Phlebitis and thrombophlebitis of superficial vessels of left lower extremity: Secondary | ICD-10-CM | POA: Diagnosis not present

## 2018-06-13 DIAGNOSIS — I83892 Varicose veins of left lower extremities with other complications: Secondary | ICD-10-CM | POA: Diagnosis not present

## 2018-06-13 DIAGNOSIS — I83813 Varicose veins of bilateral lower extremities with pain: Secondary | ICD-10-CM | POA: Diagnosis not present

## 2018-06-27 DIAGNOSIS — M8589 Other specified disorders of bone density and structure, multiple sites: Secondary | ICD-10-CM | POA: Diagnosis not present

## 2018-08-22 DIAGNOSIS — K219 Gastro-esophageal reflux disease without esophagitis: Secondary | ICD-10-CM | POA: Diagnosis not present

## 2018-08-22 DIAGNOSIS — Z1159 Encounter for screening for other viral diseases: Secondary | ICD-10-CM | POA: Diagnosis not present

## 2018-08-22 DIAGNOSIS — E7800 Pure hypercholesterolemia, unspecified: Secondary | ICD-10-CM | POA: Diagnosis not present

## 2018-08-24 ENCOUNTER — Other Ambulatory Visit: Payer: Self-pay | Admitting: Internal Medicine

## 2018-08-24 DIAGNOSIS — N6310 Unspecified lump in the right breast, unspecified quadrant: Secondary | ICD-10-CM

## 2018-08-29 DIAGNOSIS — R351 Nocturia: Secondary | ICD-10-CM | POA: Diagnosis not present

## 2018-08-29 DIAGNOSIS — I83893 Varicose veins of bilateral lower extremities with other complications: Secondary | ICD-10-CM | POA: Diagnosis not present

## 2018-08-29 DIAGNOSIS — K219 Gastro-esophageal reflux disease without esophagitis: Secondary | ICD-10-CM | POA: Diagnosis not present

## 2018-08-29 DIAGNOSIS — E7800 Pure hypercholesterolemia, unspecified: Secondary | ICD-10-CM | POA: Diagnosis not present

## 2018-08-29 DIAGNOSIS — M8580 Other specified disorders of bone density and structure, unspecified site: Secondary | ICD-10-CM | POA: Diagnosis not present

## 2018-08-29 DIAGNOSIS — Z0000 Encounter for general adult medical examination without abnormal findings: Secondary | ICD-10-CM | POA: Diagnosis not present

## 2018-09-08 ENCOUNTER — Ambulatory Visit
Admission: RE | Admit: 2018-09-08 | Discharge: 2018-09-08 | Disposition: A | Discharge status: Home / Self Care | Payer: Medicare HMO | Source: Ambulatory Visit | Attending: Internal Medicine | Admitting: Internal Medicine

## 2018-09-08 ENCOUNTER — Other Ambulatory Visit: Payer: Self-pay

## 2018-09-08 ENCOUNTER — Ambulatory Visit: Payer: Medicare HMO

## 2018-09-08 DIAGNOSIS — R922 Inconclusive mammogram: Secondary | ICD-10-CM | POA: Diagnosis not present

## 2018-09-08 DIAGNOSIS — N6310 Unspecified lump in the right breast, unspecified quadrant: Secondary | ICD-10-CM

## 2018-11-26 DIAGNOSIS — Z03818 Encounter for observation for suspected exposure to other biological agents ruled out: Secondary | ICD-10-CM | POA: Diagnosis not present

## 2018-12-02 DIAGNOSIS — Z23 Encounter for immunization: Secondary | ICD-10-CM | POA: Diagnosis not present

## 2018-12-23 ENCOUNTER — Encounter (HOSPITAL_BASED_OUTPATIENT_CLINIC_OR_DEPARTMENT_OTHER): Payer: Self-pay | Admitting: Adult Health

## 2018-12-23 ENCOUNTER — Other Ambulatory Visit: Payer: Self-pay

## 2018-12-23 ENCOUNTER — Emergency Department (HOSPITAL_BASED_OUTPATIENT_CLINIC_OR_DEPARTMENT_OTHER)
Admission: EM | Admit: 2018-12-23 | Discharge: 2018-12-23 | Disposition: A | Discharge status: Home / Self Care | Payer: Medicare HMO | Attending: Emergency Medicine | Admitting: Emergency Medicine

## 2018-12-23 DIAGNOSIS — M79662 Pain in left lower leg: Secondary | ICD-10-CM | POA: Diagnosis not present

## 2018-12-23 MED ORDER — METHOCARBAMOL 500 MG PO TABS: 500.0000 mg | ORAL_TABLET | Freq: Two times a day (BID) | ORAL | 0 refills | Status: DC

## 2018-12-23 MED ORDER — ACETAMINOPHEN 500 MG PO TABS
1000.0000 mg | ORAL_TABLET | Freq: Once | ORAL | Status: AC
  Administered 2018-12-23: 17:00:00 1000 mg via ORAL
  Filled 2018-12-23: qty 2

## 2018-12-23 NOTE — ED Triage Notes (Signed)
Presents with left calf pain after jumping off a stoop that was about 8 inches high.

## 2018-12-23 NOTE — ED Notes (Signed)
Ace wrap applied

## 2018-12-23 NOTE — Discharge Instructions (Signed)
Use Ace wrap for compression, ice and elevate the calf.  Use Tylenol and prescribed muscle relaxer as needed for pain relief.  If symptoms are not improving in 1 week please follow-up with Dr. Raeford Razor with sports medicine.  Return to the ED for significantly worsening pain, numbness, weakness or any other new or concerning symptoms.

## 2018-12-23 NOTE — ED Provider Notes (Signed)
[facility] EMERGENCY DEPARTMENT Provider Note   CSN: [id number] Arrival date & time: 12/23/18  1526     History   Chief Complaint Chief Complaint  Patient presents with  . Leg Pain    HPI [place] is a 73 y.o. female.     [place] is a 73 y.o. female with a history of chronic venous insufficiency, otherwise healthy, who presents to the emergency department for evaluation of left calf pain.  This started immediately after jumping down off her stoop which is about 8 inches high.  She reports she has localized pain in the back of her left calf muscle, she denies any pain over the anterior shin.  No pain at the knee ankle or foot.  She has been able to ambulate with some discomfort.  She tried to use a knee brace to apply pressure to the area and placed ice on it when pain continued she presented for evaluation.  She denies any numbness tingling or weakness in the legs she has not noted any swelling.  She does wear chronic compression stockings due to venous insufficiency.  No other injuries.  No other aggravating or alleviating factors.     Past Medical History:  Diagnosis Date  . Edema leg    left > right  . Venous insufficiency (chronic) (peripheral)     Patient Active Problem List   Diagnosis Date Noted  . Varicose veins of lower extremities with other complications [id number]  . Chronic venous insufficiency 10/24/2010    Past Surgical History:  Procedure Laterality Date  . ABDOMINAL HYSTERECTOMY    . BUNIONECTOMY Bilateral   . LASER ABLATION     right - varicose veins  . LEG SURGERY       OB History    Gravida  0   Para  0   Term  0   Preterm  0   AB  0   Living  0     SAB  0   TAB  0   Ectopic  0   Multiple  0   Live Births  0        Obstetric Comments  1 adopted daughter          Home Medications    Prior to Admission medications   Medication Sig Start Date End Date Taking? Authorizing Provider   benzocaine-resorcinol (VAGISIL) 5-2 % vaginal cream Place vaginally at bedtime.    [provider]  Calcium Citrate-Vitamin D (CITRACAL + D PO) Take by mouth. 315mg /200mg  strength; Take two tabs daily     [provider]  Cholecalciferol (VITAMIN D) 2000 UNITS tablet Take 2,000 Units by mouth daily.      [provider]  clobetasol cream (TEMOVATE) [id number] % Apply 1 application topically daily. Thin layer on vulva 01/05/18   Princess Bruins, MD  clotrimazole-betamethasone (LOTRISONE) cream Apply 1 application topically 2 (two) times daily.    [provider]  estradiol (ESTRACE) 0.1 MG/GM vaginal cream Place [id number] Applicatorfuls vaginally 2 (two) times a week. 09/27/17   Princess Bruins, MD  methocarbamol (ROBAXIN) 500 MG tablet Take 1 tablet (500 mg total) by mouth 2 (two) times daily. 12/23/18   Jacqlyn Larsen, PA-C  Multiple Vitamins-Minerals (MULTIVITAMIN WITH MINERALS) tablet Take 1 tablet by mouth daily.      [provider]  triamcinolone (KENALOG) 0.025 % cream Apply 1 application topically 2 (two) times daily.    [provider]  Family History Family History  Problem Relation Age of Onset  . Other Mother        PAD w/ hx left BKA   . Heart attack Father        died age 54  . Heart disease Brother        died age 93- CHF    Social History Social History   Tobacco Use  . Smoking status: Never Smoker  . Smokeless tobacco: Never Used  Substance Use Topics  . Alcohol use: Yes    Alcohol/week: 1.0 standard drinks    Types: 1 Standard drinks or equivalent per week    Comment: SOCIAL  . Drug use: No     Allergies   Beef-derived products   Review of Systems Review of Systems  Constitutional: Negative for chills and fever.  Cardiovascular: Negative for leg swelling.  Musculoskeletal: Positive for myalgias. Negative for arthralgias and joint swelling.  Skin: Negative for color change and rash.  Neurological:  Negative for weakness and numbness.     Physical Exam Updated Vital Signs BP (!) 145/64   Pulse 63   Temp 98.3 F (36.8 C) (Oral)   Resp 18   Ht 5\' 10"  (1.778 m)   Wt 95.3 kg   SpO2 98%   BMI 30.13 kg/m   Physical Exam Vitals signs and nursing note reviewed.  Constitutional:      General: She is not in acute distress.    Appearance: She is well-developed. She is not diaphoretic.  HENT:     Head: Normocephalic and atraumatic.  Eyes:     General:        Right eye: No discharge.        Left eye: No discharge.  Pulmonary:     Effort: Pulmonary effort is normal. No respiratory distress.  Musculoskeletal:     Comments: Focal tenderness to palpation over the left posterior calf with palpable spasm, no overlying bruising or discoloration.  Patient does not have any tenderness at the ankle at the insertion of the Achilles, Achilles tendon is palpable and intact.  Patient able to flex and extend with 5/5 strength, negative Thompson's test.  Patient also has 5/5 strength with flexion and extension at the knee.  No tenderness to palpation over the knee.  No pain over the anterior shin.  Skin:    General: Skin is warm and dry.     Capillary Refill: Capillary refill takes less than 2 seconds.  Neurological:     Mental Status: She is alert and oriented to person, place, and time.     Coordination: Coordination normal.  Psychiatric:        Mood and Affect: Mood normal.        Behavior: Behavior normal.      ED Treatments / Results  Labs (all labs ordered are listed, but only abnormal results are displayed) Labs Reviewed - No data to display  EKG None  Radiology No results found.  Procedures Procedures (including critical care time)  Medications Ordered in ED Medications  acetaminophen (TYLENOL) tablet 1,000 mg (1,000 mg Oral Given 12/23/18 1720)     Initial Impression / Assessment and Plan / ED Course  I have reviewed the triage vital signs and the nursing notes.   Pertinent labs & imaging results that were available during my care of the patient were reviewed by me and considered in my medical decision making (see chart for details).  73 year old female with focal tenderness over the posterior calf  muscle after jumping down from a stooped today.  She has no bony tenderness and no tenderness at the ankle foot or knee.  2+ pulses, normal sensation and 5/5 strength with plantar and dorsiflexion, as well as flexion and extension of the knee.  Achilles tendon is intact on palpation and negative Thompson test.  Without any bony tenderness or deformity I do not think that x-ray would be beneficial or add to management at this time.  Exam is consistent with calf strain.  Will treat with Tylenol, Robaxin, ice and elevation, Ace wrap applied.  Sports medicine follow-up in 1 week if symptoms not improved.  Case discussed with Dr. Sherry Ruffing who agrees with assessment and plan, patient discharged home in good condition.  Final Clinical Impressions(s) / ED Diagnoses   Final diagnoses:  Pain of left calf    ED Discharge Orders         Ordered    methocarbamol (ROBAXIN) 500 MG tablet  2 times daily     12/23/18 1746           Jacqlyn Larsen, [place] 12/23/18 1811    Tegeler, Gwenyth Allegra, MD 12/24/18 0005

## 2018-12-28 DIAGNOSIS — S86112A Strain of other muscle(s) and tendon(s) of posterior muscle group at lower leg level, left leg, initial encounter: Secondary | ICD-10-CM | POA: Diagnosis not present

## 2018-12-28 DIAGNOSIS — G5700 Lesion of sciatic nerve, unspecified lower limb: Secondary | ICD-10-CM | POA: Diagnosis not present

## 2019-01-06 DIAGNOSIS — L304 Erythema intertrigo: Secondary | ICD-10-CM | POA: Diagnosis not present

## 2019-06-20 ENCOUNTER — Other Ambulatory Visit: Payer: Self-pay | Admitting: Internal Medicine

## 2019-06-20 DIAGNOSIS — Z1231 Encounter for screening mammogram for malignant neoplasm of breast: Secondary | ICD-10-CM

## 2019-08-11 ENCOUNTER — Other Ambulatory Visit: Payer: Self-pay

## 2019-08-14 ENCOUNTER — Encounter: Payer: Self-pay | Admitting: Obstetrics & Gynecology

## 2019-08-14 ENCOUNTER — Ambulatory Visit (INDEPENDENT_AMBULATORY_CARE_PROVIDER_SITE_OTHER): Payer: Medicare HMO | Admitting: Obstetrics & Gynecology

## 2019-08-14 ENCOUNTER — Other Ambulatory Visit: Payer: Self-pay

## 2019-08-14 VITALS — BP 140/80 | Ht 68.75 in | Wt 200.0 lb

## 2019-08-14 DIAGNOSIS — Z01419 Encounter for gynecological examination (general) (routine) without abnormal findings: Secondary | ICD-10-CM | POA: Diagnosis not present

## 2019-08-14 DIAGNOSIS — Z1272 Encounter for screening for malignant neoplasm of vagina: Secondary | ICD-10-CM | POA: Diagnosis not present

## 2019-08-14 DIAGNOSIS — R87622 Low grade squamous intraepithelial lesion on cytologic smear of vagina (LGSIL): Secondary | ICD-10-CM | POA: Diagnosis not present

## 2019-08-14 DIAGNOSIS — N952 Postmenopausal atrophic vaginitis: Secondary | ICD-10-CM

## 2019-08-14 DIAGNOSIS — Z9289 Personal history of other medical treatment: Secondary | ICD-10-CM | POA: Diagnosis not present

## 2019-08-14 DIAGNOSIS — L292 Pruritus vulvae: Secondary | ICD-10-CM

## 2019-08-14 MED ORDER — FLUCONAZOLE 150 MG PO TABS: 150.0000 mg | ORAL_TABLET | Freq: Every day | ORAL | 3 refills | Status: AC

## 2019-08-14 MED ORDER — CLOBETASOL PROPIONATE 0.05 % EX CREA: 1.0000 "application " | TOPICAL_CREAM | Freq: Every day | CUTANEOUS | 4 refills | Status: DC

## 2019-08-14 NOTE — Progress Notes (Signed)
Casey Woods 1945/05/24 [id number]   History:    74 y.o. G0 Engaged  RP:  Established patient presenting for annual gyn exam   HPI: CAIS.  No pelvic pain.  Continued vulvar itching, mildly improved with Clobetasol cream.  Urine/BMs wnl.  Breasts wnl.  BMI 29.75.  Health Labs with Dr Shelia Media.  [facility] 4 yrs ago.   Past medical history,surgical history, family history and social history were all reviewed and documented in the EPIC chart.  Gynecologic History No LMP recorded. Patient has had a hysterectomy.  Obstetric History OB History  Gravida Para Term Preterm AB Living  0 0 0 0 0 0  SAB TAB Ectopic Multiple Live Births  0 0 0 0 0  Obstetric Comments  1 adopted daughter      ROS: A ROS was performed and pertinent positives and negatives are included in the history.  GENERAL: No fevers or chills. HEENT: No change in vision, no earache, sore throat or sinus congestion. NECK: No pain or stiffness. CARDIOVASCULAR: No chest pain or pressure. No palpitations. PULMONARY: No shortness of breath, cough or wheeze. GASTROINTESTINAL: No abdominal pain, nausea, vomiting or diarrhea, melena or bright red blood per rectum. GENITOURINARY: No urinary frequency, urgency, hesitancy or dysuria. MUSCULOSKELETAL: No joint or muscle pain, no back pain, no recent trauma. DERMATOLOGIC: No rash, no itching, no lesions. ENDOCRINE: No polyuria, polydipsia, no heat or cold intolerance. No recent change in weight. HEMATOLOGICAL: No anemia or easy bruising or bleeding. NEUROLOGIC: No headache, seizures, numbness, tingling or weakness. PSYCHIATRIC: No depression, no loss of interest in normal activity or change in sleep pattern.     Exam:   BP 140/80   Ht 5' 8.75" (1.746 m)   Wt 200 lb (90.7 kg)   BMI 29.75 kg/m   Body mass index is 29.75 kg/m.  General appearance : Well developed well nourished female. No acute distress HEENT: Eyes: no retinal hemorrhage or exudates,  Neck supple, trachea midline,  no carotid bruits, no thyroidmegaly Lungs: Clear to auscultation, no rhonchi or wheezes, or rib retractions  Heart: Regular rate and rhythm, no murmurs or gallops Breast:Examined in sitting and supine position were symmetrical in appearance, no palpable masses or tenderness,  no skin retraction, no nipple inversion, no nipple discharge, no skin discoloration, no axillary or supraclavicular lymphadenopathy Abdomen: no palpable masses or tenderness, no rebound or guarding Extremities: no edema or skin discoloration or tenderness  Pelvic: Vulva: Normal             Vagina: No gross lesions or discharge.  Short vagina.  Pap reflex done.  Cervix/Uterus absent  Adnexa  Without masses or tenderness  Anus: Normal   Assessment/Plan:  74 y.o. female for annual exam   1. Encounter for Papanicolaou smear of vagina as part of routine gynecological examination Gynecologic exam unchanged with short vagina and absent cervix and uterus.  Pap reflex done.  Last Pap test February 2020 showed ASCUS with high risk HPV positive.  HPV 16-18-45 were negative.   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  HPV 16-18-45 Negative 04/2018.  3. Vulvar itching Fluconazole and Clobetasol prescribed.  Usage reviewed.  4. Post-menopausal atrophic vaginitis Will use coconut oil as needed.  Other orders - fluconazole (DIFLUCAN) 150 MG tablet; Take 1 tablet (150 mg total) by mouth daily for 3 days. - clobetasol cream (TEMOVATE) 0.05 %; Apply 1 application topically daily. Thin layer on vulva  Princess Bruins MD, 4:09  PM 08/14/2019

## 2019-08-16 LAB — PAP IG W/ RFLX HPV ASCU

## 2019-08-19 ENCOUNTER — Encounter: Payer: Self-pay | Admitting: Obstetrics & Gynecology

## 2019-08-19 NOTE — Patient Instructions (Signed)
1. Encounter for Papanicolaou smear of vagina as part of routine gynecological examination Gynecologic exam unchanged with short vagina and absent cervix and uterus.  Pap reflex done.  Last Pap test February 2020 showed ASCUS with high risk HPV positive.  HPV 16-18-45 were negative.   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  HPV 16-18-45 Negative 04/2018.  3. Vulvar itching Fluconazole and Clobetasol prescribed.  Usage reviewed.  4. Post-menopausal atrophic vaginitis Will use coconut oil as needed.  Other orders - fluconazole (DIFLUCAN) 150 MG tablet; Take 1 tablet (150 mg total) by mouth daily for 3 days. - clobetasol cream (TEMOVATE) 0.05 %; Apply 1 application topically daily. Thin layer on vulva  Inola, it was a pleasure seeing you today!  I will inform you of your results as soon as they are available.

## 2019-09-11 ENCOUNTER — Ambulatory Visit
Admission: RE | Admit: 2019-09-11 | Discharge: 2019-09-11 | Disposition: A | Discharge status: Home / Self Care | Payer: Medicare HMO | Source: Ambulatory Visit | Attending: Internal Medicine | Admitting: Internal Medicine

## 2019-09-11 ENCOUNTER — Other Ambulatory Visit: Payer: Self-pay

## 2019-09-11 DIAGNOSIS — Z1231 Encounter for screening mammogram for malignant neoplasm of breast: Secondary | ICD-10-CM

## 2020-01-30 ENCOUNTER — Ambulatory Visit: Payer: Medicare HMO | Attending: Internal Medicine

## 2020-01-30 ENCOUNTER — Other Ambulatory Visit (HOSPITAL_BASED_OUTPATIENT_CLINIC_OR_DEPARTMENT_OTHER): Payer: Self-pay | Admitting: Internal Medicine

## 2020-01-30 DIAGNOSIS — Z23 Encounter for immunization: Secondary | ICD-10-CM

## 2020-02-02 MED FILL — PFIZER-BIONTECH COVID-19 VA: 30 | 1 days supply | Qty: 0 | Fill #0

## 2020-02-10 IMAGING — US US EXTREM LOW VENOUS*L*
1 series · 13 of 24 positions shown · non-contrast
Comparison: None.

CLINICAL DATA: 72-year-old female with a history of bilateral
venous sclerotherapy in 7776



[Series 1: us extrem low venous*left* · 0.09mm/px · 13 of 29 slices shown]
[im 1/29]
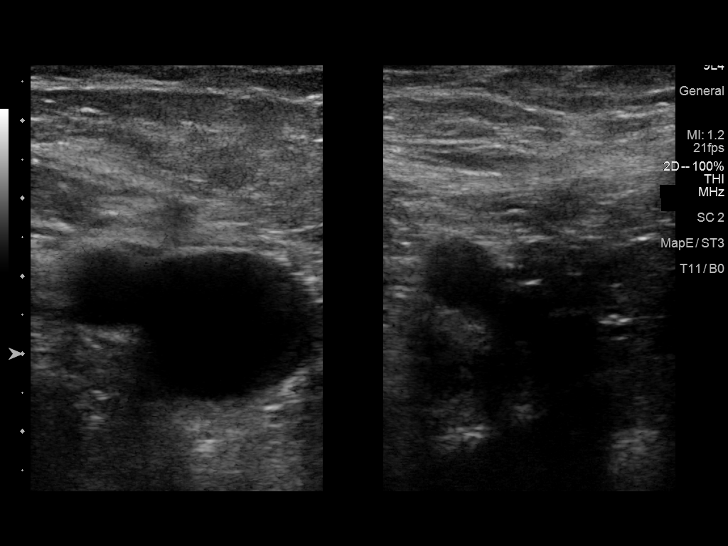
[im 3/29]
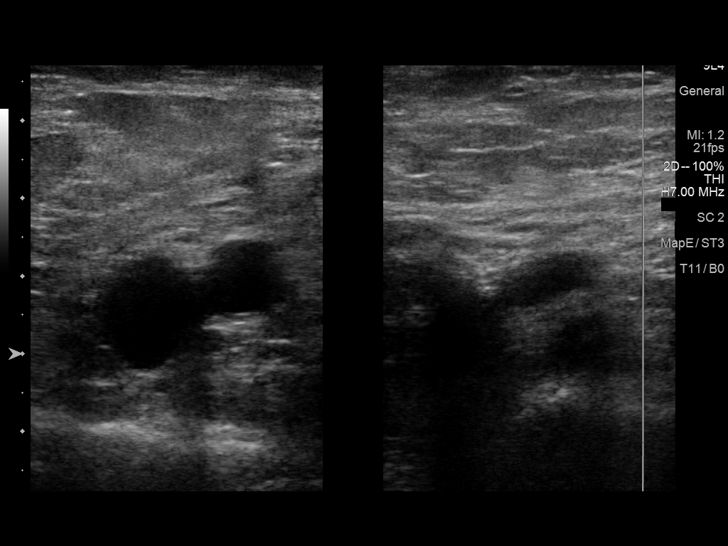
[im 5/29]
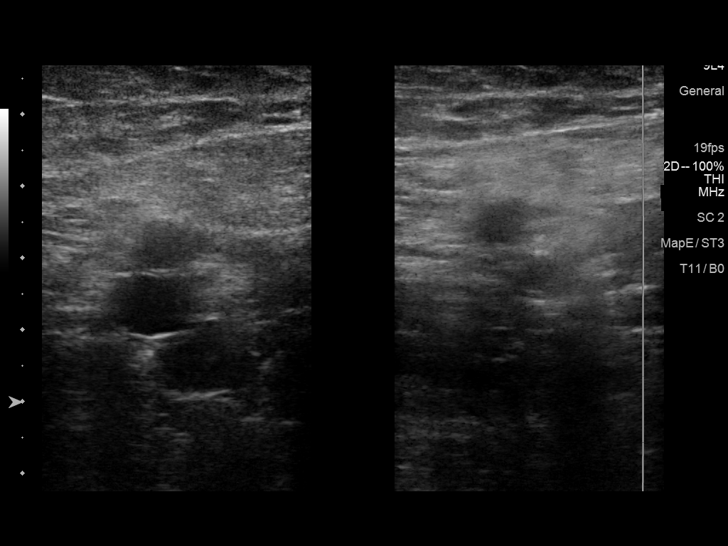
[im 8/29]
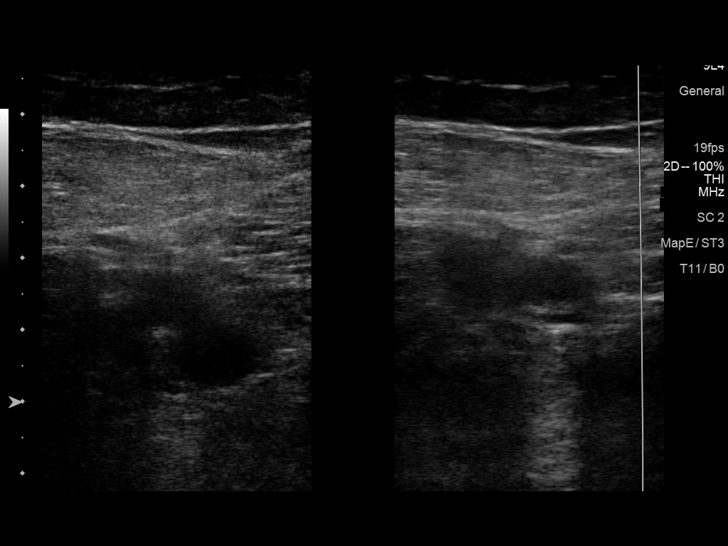
[im 10/29]
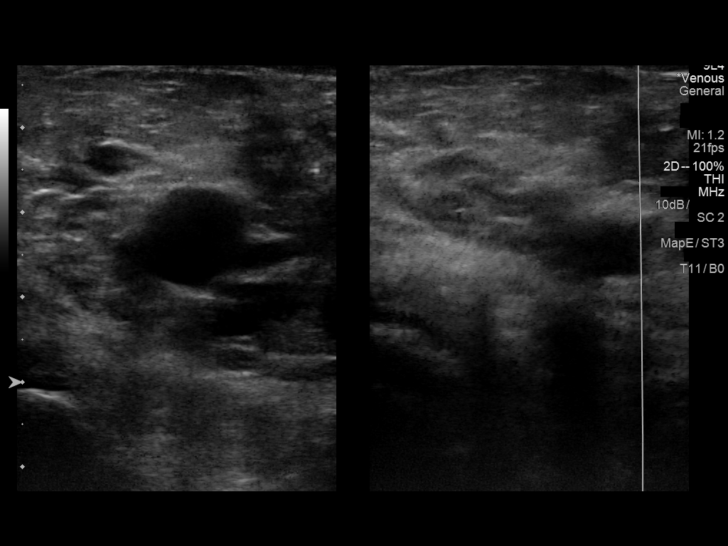
[im 13/29]
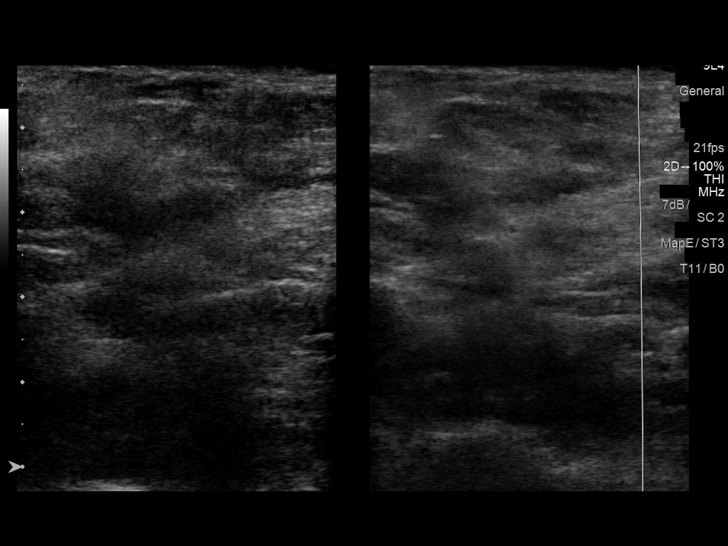
[im 15/29]
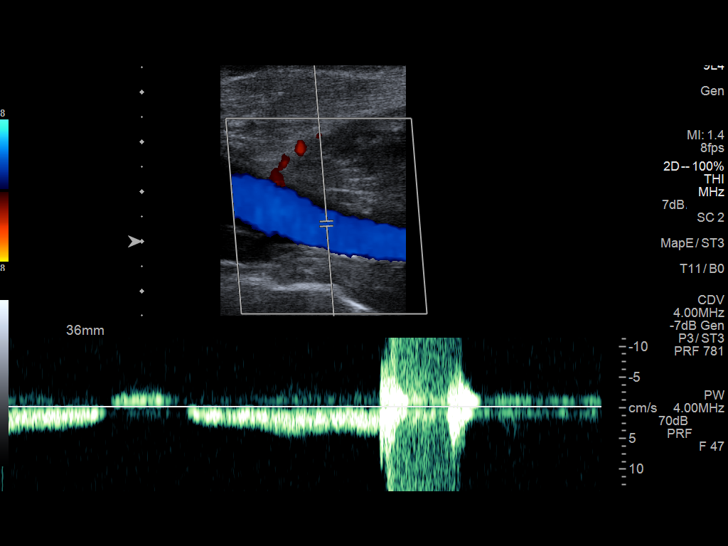
[im 16/29]
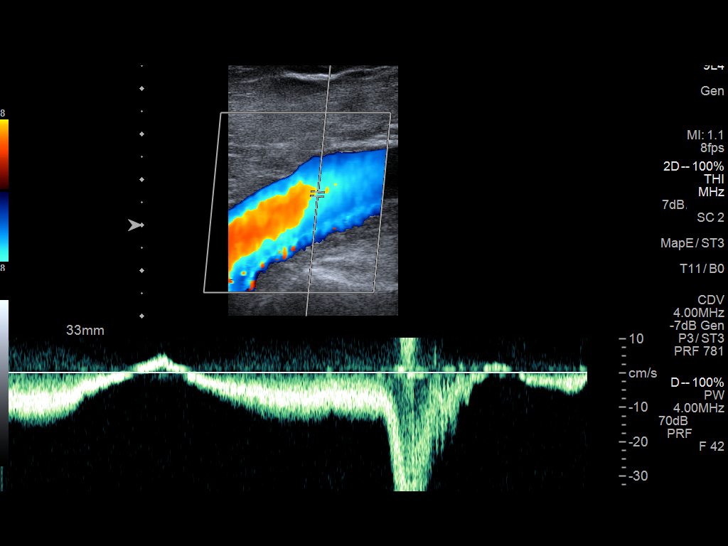
[im 19/29]
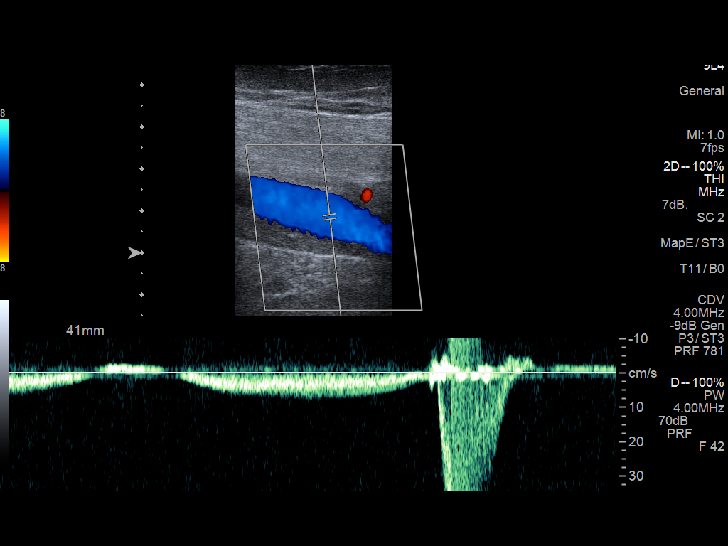
[im 21/29]
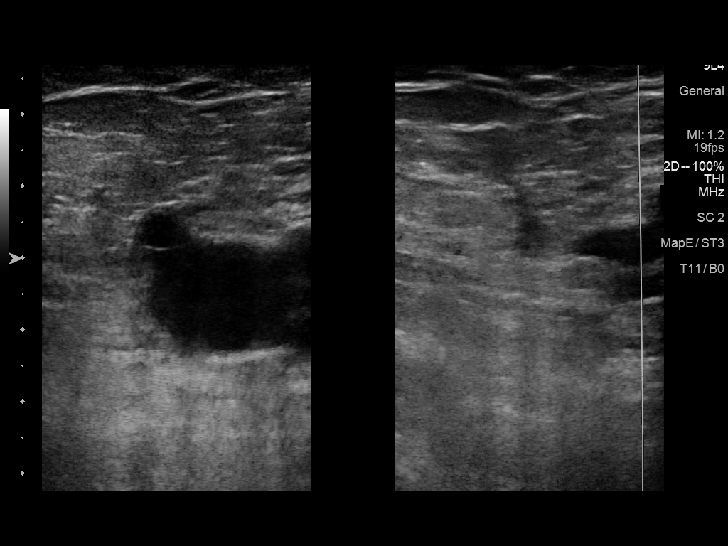
[im 24/29]
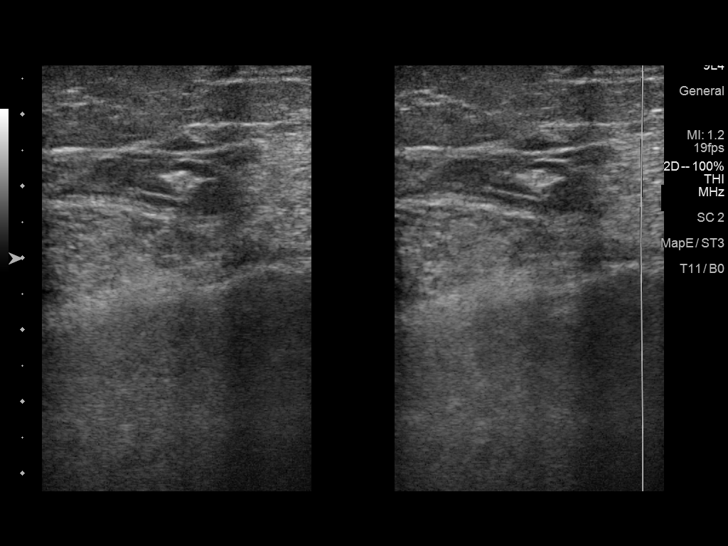
[im 26/29]
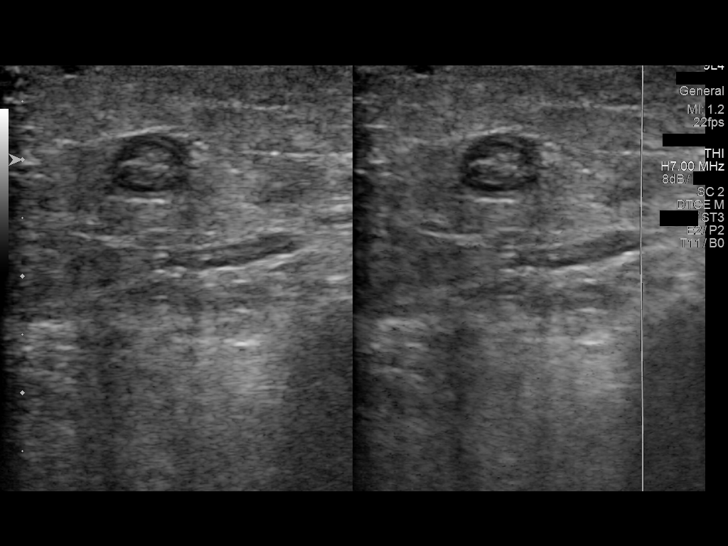
[im 29/29]
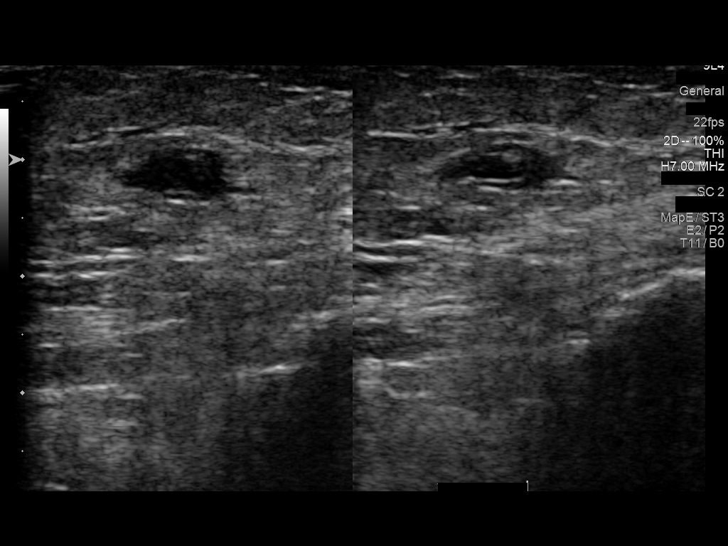

[13 of 24 positions shown; findings below may reference images not displayed]

FINDINGS: Contralateral Common Femoral Vein: Respiratory phasicity is normal
and symmetric with the symptomatic side. No evidence of thrombus.
Normal compressibility.

Common Femoral Vein: No evidence of thrombus. Normal
compressibility, respiratory phasicity and response to augmentation.

Saphenofemoral Junction: No evidence of thrombus. Normal
compressibility and flow on color Doppler imaging.

Profunda Femoral Vein: No evidence of thrombus. Normal
compressibility and flow on color Doppler imaging.

Femoral Vein: No evidence of thrombus. Normal compressibility,
respiratory phasicity and response to augmentation.

Popliteal Vein: No evidence of thrombus. Normal compressibility,
respiratory phasicity and response to augmentation.

Calf Veins: Posterior tibial vein is compressible with no visualized
thrombus and flow maintained. Peroneal vein not visualized.

Superficial Great Saphenous Vein: Treatment effects of prior left
great saphenous vein ablation

Other Findings:  None.
IMPRESSION: Sonographic survey of the left lower extremity negative for DVT

## 2020-02-12 ENCOUNTER — Ambulatory Visit (INDEPENDENT_AMBULATORY_CARE_PROVIDER_SITE_OTHER): Payer: Medicare HMO | Admitting: Obstetrics & Gynecology

## 2020-02-12 ENCOUNTER — Encounter: Payer: Self-pay | Admitting: Obstetrics & Gynecology

## 2020-02-12 ENCOUNTER — Other Ambulatory Visit: Payer: Self-pay

## 2020-02-12 VITALS — BP 136/70

## 2020-02-12 DIAGNOSIS — R87622 Low grade squamous intraepithelial lesion on cytologic smear of vagina (LGSIL): Secondary | ICD-10-CM | POA: Diagnosis not present

## 2020-02-12 DIAGNOSIS — L292 Pruritus vulvae: Secondary | ICD-10-CM

## 2020-02-12 MED ORDER — CLOBETASOL PROPIONATE 0.05 % EX CREA: 1.0000 "application " | TOPICAL_CREAM | Freq: Every day | CUTANEOUS | 4 refills | Status: DC

## 2020-02-12 NOTE — Progress Notes (Signed)
    [place] [birth year] [id number]        74 y.o.  G0P0000   RP: 6 month Pap  HPI: CAIS.   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  Pap 07/2019 LGSIL. 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  HPV 16-18-45 were negative.  H/O LGSIL.  Vulvar itching controled with Clobetasol cream 7.71% a thin application on vulva daily.   OB History  Gravida Para Term Preterm AB Living  0 0 0 0 0 0  SAB TAB Ectopic Multiple Live Births  0 0 0 0 0  Obstetric Comments  1 adopted daughter     Past medical history,surgical history, problem list, medications, allergies, family history and social history were all reviewed and documented in the EPIC chart.   Directed ROS with pertinent positives and negatives documented in the history of present illness/assessment and plan.  Exam:  Vitals:   02/12/20 1447  BP: 136/70   General appearance:  Normal  Gynecologic exam: Vulva normal.  Speculum:  Short vagina.  Pap test done.   Assessment/Plan:  74 y.o. G0P0000   1. LGSIL Pap smear of vagina 6 month repeat Pap test done today.   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    2. Vulvar itching Controled on Clobetasol cream, thin vulvar application daily.  Represcribed.  Other orders - clobetasol cream (TEMOVATE) 0.05 %; Apply 1 application topically daily. Thin layer on vulva daily  Princess Bruins MD, 3:04 PM 02/12/2020

## 2020-02-14 LAB — PAP IG W/ RFLX HPV ASCU

## 2020-07-02 ENCOUNTER — Ambulatory Visit: Payer: Medicare HMO

## 2020-07-02 ENCOUNTER — Ambulatory Visit: Payer: Self-pay | Attending: Internal Medicine

## 2020-07-02 DIAGNOSIS — Z23 Encounter for immunization: Secondary | ICD-10-CM

## 2020-07-02 NOTE — Progress Notes (Signed)
   Covid-19 Vaccination Clinic  Name:  CENDY OCONNOR    MRN: [id number] DOB: [birth year]  07/02/2020  Ms. Schlesinger was observed post Covid-19 immunization for 15 minutes without incident. She was provided with Vaccine Information Sheet and instruction to access the V-Safe system.   Ms. Citron was instructed to call 911 with any severe reactions post vaccine: Marland Kitchen Difficulty breathing  . Swelling of face and throat  . A fast heartbeat  . A bad rash all over body  . Dizziness and weakness   Immunizations Administered    Name Date Dose VIS Date Route   PFIZER Comrnaty(Gray TOP) Covid-19 Vaccine 07/02/2020  1:56 PM 0.3 mL 02/29/2020 Intramuscular   Manufacturer: [employer]   Lot: [id number]   NDC: [phone]

## 2020-07-08 ENCOUNTER — Other Ambulatory Visit (HOSPITAL_BASED_OUTPATIENT_CLINIC_OR_DEPARTMENT_OTHER): Payer: Self-pay

## 2020-07-08 MED ORDER — PFIZER-BIONT COVID-19 VAC-TRIS 30 MCG/0.3ML IM SUSP
INTRAMUSCULAR | 0 refills | Status: DC
  Filled 2020-07-08: qty 0.3, 1d supply, fill #0

## 2020-07-30 ENCOUNTER — Other Ambulatory Visit: Payer: Self-pay | Admitting: Internal Medicine

## 2020-07-30 DIAGNOSIS — Z1231 Encounter for screening mammogram for malignant neoplasm of breast: Secondary | ICD-10-CM

## 2020-08-15 ENCOUNTER — Encounter: Payer: Self-pay | Admitting: Obstetrics & Gynecology

## 2020-08-15 ENCOUNTER — Ambulatory Visit (INDEPENDENT_AMBULATORY_CARE_PROVIDER_SITE_OTHER): Payer: Medicare HMO | Admitting: Obstetrics & Gynecology

## 2020-08-15 ENCOUNTER — Other Ambulatory Visit: Payer: Self-pay

## 2020-08-15 ENCOUNTER — Other Ambulatory Visit (HOSPITAL_COMMUNITY)
Admission: RE | Admit: 2020-08-15 | Discharge: 2020-08-15 | Disposition: A | Discharge status: Home / Self Care | Payer: Self-pay | Source: Ambulatory Visit | Attending: Obstetrics & Gynecology | Admitting: Obstetrics & Gynecology

## 2020-08-15 VITALS — BP 134/80 | Ht 70.0 in | Wt 188.0 lb

## 2020-08-15 DIAGNOSIS — L292 Pruritus vulvae: Secondary | ICD-10-CM

## 2020-08-15 DIAGNOSIS — Z780 Asymptomatic menopausal state: Secondary | ICD-10-CM | POA: Diagnosis not present

## 2020-08-15 DIAGNOSIS — R87622 Low grade squamous intraepithelial lesion on cytologic smear of vagina (LGSIL): Secondary | ICD-10-CM | POA: Diagnosis not present

## 2020-08-15 DIAGNOSIS — Z01419 Encounter for gynecological examination (general) (routine) without abnormal findings: Secondary | ICD-10-CM | POA: Insufficient documentation

## 2020-08-15 DIAGNOSIS — Z1272 Encounter for screening for malignant neoplasm of vagina: Secondary | ICD-10-CM | POA: Insufficient documentation

## 2020-08-15 DIAGNOSIS — Z9189 Other specified personal risk factors, not elsewhere classified: Secondary | ICD-10-CM | POA: Diagnosis not present

## 2020-08-15 MED ORDER — NYSTATIN-TRIAMCINOLONE 100000-0.1 UNIT/GM-% EX OINT: 1.0000 "application " | TOPICAL_OINTMENT | Freq: Every day | CUTANEOUS | 4 refills | Status: DC

## 2020-08-15 NOTE — Progress Notes (Signed)
Casey Woods 09/25/1945 [id number]   History:    75 y.o. G0 Engaged  QA:[id number] presenting for annual gyn exam   ERD:EYCX. 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  Pap May and November 2021 LGSIL. 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 HPV 16-18-45 were negative.No pelvic pain. Continued vulvar itching, mildly improved with Clobetasol cream. Urine/BMs wnl. Breasts wnl. BMI 26.98. Health Labs with Dr Shelia Media.  [facility] 5 yrs ago.   Past medical history,surgical history, family history and social history were all reviewed and documented in the EPIC chart.  Gynecologic History CAIS  Obstetric History OB History  Gravida Para Term Preterm AB Living  0 0 0 0 0 0  SAB IAB Ectopic Multiple Live Births  0 0 0 0 0  Obstetric Comments  1 adopted daughter      ROS: A ROS was performed and pertinent positives and negatives are included in the history.  GENERAL: No fevers or chills. HEENT: No change in vision, no earache, sore throat or sinus congestion. NECK: No pain or stiffness. CARDIOVASCULAR: No chest pain or pressure. No palpitations. PULMONARY: No shortness of breath, cough or wheeze. GASTROINTESTINAL: No abdominal pain, nausea, vomiting or diarrhea, melena or bright red blood per rectum. GENITOURINARY: No urinary frequency, urgency, hesitancy or dysuria. MUSCULOSKELETAL: No joint or muscle pain, no back pain, no recent trauma. DERMATOLOGIC: No rash, no itching, no lesions. ENDOCRINE: No polyuria, polydipsia, no heat or cold intolerance. No recent change in weight. HEMATOLOGICAL: No anemia or easy bruising or bleeding. NEUROLOGIC: No headache, seizures, numbness, tingling or weakness. PSYCHIATRIC: No depression, no loss of interest in normal activity or change in sleep pattern.     Exam:   BP 134/80 (BP Location: Right Arm, Patient Position: Sitting, Cuff Size: Normal)   Ht 5\' 10"  (1.778 m)   Wt 188 lb (85.3 kg)   BMI 26.98 kg/m   Body mass index is 26.98  kg/m.  General appearance : Well developed well nourished female. No acute distress HEENT: Eyes: no retinal hemorrhage or exudates,  Neck supple, trachea midline, no carotid bruits, no thyroidmegaly Lungs: Clear to auscultation, no rhonchi or wheezes, or rib retractions  Heart: Regular rate and rhythm, no murmurs or gallops Breast:Examined in sitting and supine position were symmetrical in appearance, no palpable masses or tenderness,  no skin retraction, no nipple inversion, no nipple discharge, no skin discoloration, no axillary or supraclavicular lymphadenopathy Abdomen: no palpable masses or tenderness, no rebound or guarding Extremities: no edema or skin discoloration or tenderness  Pelvic: Vulva: Normal             Vagina: Short.  Pap reflex done.  Cervix/Uterus absent  Adnexa  Without masses or tenderness  Anus: Normal   Assessment/Plan:  74 y.o. female for annual exam   1. Encounter for Papanicolaou smear of vagina as part of routine gynecological examination Stable Gynecologic exam.  Pap reflex done of vagina.  Breasts normal.  Screening mammo neg 08/2019.  [facility] 5 yrs ago.  Improved BMI at 26.98.  Continue with fitness and healthy nutrition. Health labs with Dr Shelia Media. - Cytology - PAP( [place] [facility])  2. LGSIL Pap smear of vagina - Pap reflex of vagina   3. Postmenopause Well on no HRT.  Vit D supplements/Ca++ 1.5 g/d.  Will check with Dr Shelia Media if due for a Bone Density.  4. Vulvar itching Not much improved on Clobetasol.  Decision to try Mycolog daily instead.  Thin application on affected vulva.  Prescription sent to pharmacy.  Other orders - nystatin-triamcinolone ointment (MYCOLOG); Apply 1 application topically daily.  Princess Bruins MD, 10:11 AM 08/15/2020

## 2020-08-21 LAB — CYTOLOGY - PAP

## 2020-10-02 ENCOUNTER — Ambulatory Visit: Payer: Self-pay

## 2020-11-15 ENCOUNTER — Other Ambulatory Visit: Payer: Self-pay

## 2020-11-15 ENCOUNTER — Ambulatory Visit
Admission: RE | Admit: 2020-11-15 | Discharge: 2020-11-15 | Disposition: A | Discharge status: Home / Self Care | Payer: Medicare HMO | Source: Ambulatory Visit | Attending: Internal Medicine | Admitting: Internal Medicine

## 2020-11-15 DIAGNOSIS — Z1231 Encounter for screening mammogram for malignant neoplasm of breast: Secondary | ICD-10-CM

## 2020-12-19 ENCOUNTER — Ambulatory Visit: Payer: Medicare HMO | Attending: Internal Medicine

## 2020-12-19 DIAGNOSIS — Z23 Encounter for immunization: Secondary | ICD-10-CM

## 2020-12-20 NOTE — Progress Notes (Signed)
   Covid-19 Vaccination Clinic  Name:  SAMADHI MAHURIN    MRN: [id number] DOB: January 08, 1946  12/20/2020  Ms. Dugger was observed post Covid-19 immunization for 15 minutes without incident. She was provided with Vaccine Information Sheet and instruction to access the V-Safe system.   Ms. Gragert was instructed to call 911 with any severe reactions post vaccine: Difficulty breathing  Swelling of face and throat  A fast heartbeat  A bad rash all over body  Dizziness and weakness

## 2020-12-31 ENCOUNTER — Other Ambulatory Visit (HOSPITAL_BASED_OUTPATIENT_CLINIC_OR_DEPARTMENT_OTHER): Payer: Self-pay

## 2020-12-31 MED ORDER — COVID-19MRNA BIVAL VACC PFIZER 30 MCG/0.3ML IM SUSP
INTRAMUSCULAR | 0 refills | Status: DC
  Filled 2020-12-31: qty 0.3, 1d supply, fill #0

## 2021-01-21 ENCOUNTER — Emergency Department (HOSPITAL_BASED_OUTPATIENT_CLINIC_OR_DEPARTMENT_OTHER): Payer: Medicare HMO

## 2021-01-21 ENCOUNTER — Emergency Department (HOSPITAL_BASED_OUTPATIENT_CLINIC_OR_DEPARTMENT_OTHER)
Admission: EM | Admit: 2021-01-21 | Discharge: 2021-01-21 | Disposition: A | Discharge status: Home / Self Care | Payer: Medicare HMO | Attending: Emergency Medicine | Admitting: Emergency Medicine

## 2021-01-21 ENCOUNTER — Encounter (HOSPITAL_BASED_OUTPATIENT_CLINIC_OR_DEPARTMENT_OTHER): Payer: Self-pay

## 2021-01-21 ENCOUNTER — Other Ambulatory Visit: Payer: Self-pay

## 2021-01-21 DIAGNOSIS — I872 Venous insufficiency (chronic) (peripheral): Secondary | ICD-10-CM | POA: Insufficient documentation

## 2021-01-21 DIAGNOSIS — M7989 Other specified soft tissue disorders: Secondary | ICD-10-CM | POA: Diagnosis present

## 2021-01-21 NOTE — Discharge Instructions (Addendum)
Your ultrasound was negative for DVT and examination showed no evidence of infection or cellulitis. There is no concern for new onset heart failure, so symptoms are likely due to worsening venous insufficiency. Continue wearing your compression socks and elevating your feet whenever you are at rest. Call your venous doctor at your earliest opportunity for follow up.

## 2021-01-21 NOTE — ED Provider Notes (Signed)
[facility] Provider Note   CSN: [id number] Arrival date & time: 01/21/21  1350     History Chief Complaint  Patient presents with   Foot Swelling    Casey Woods is a 75 y.o. female.  75 year old F with history of chronic venous insufficiency and varicose veins presents today with CC of right foot swelling and pain that started five days ago. Pt states that she first noticed swelling on the dorsal aspect of her foot on 10/27 with a dull pain described as an aching/soreness. She also reports decreased sensation of the first, second and third digit. She states symptoms improve with her foot elevated but return as soon as she stands up and walks around. She wears compression socks with no noticeable difference in symptom severity. Denies swelling of the calf, calf pain, redness, inflammation and fevers. Pt currently takes medication prescribed by venous doctor. Denies history of heart disease, DM, DVT and PE.   The history is provided by the patient.      Past Medical History:  Diagnosis Date   Edema leg    left > right   Venous insufficiency (chronic) (peripheral)     Patient Active Problem List   Diagnosis Date Noted   Varicose veins of lower extremities with other complications 87/86/7672   Chronic venous insufficiency 10/24/2010    Past Surgical History:  Procedure Laterality Date   ABDOMINAL HYSTERECTOMY     BUNIONECTOMY Bilateral    LASER ABLATION     right - varicose veins   LEG SURGERY       OB History     Gravida  0   Para  0   Term  0   Preterm  0   AB  0   Living  0      SAB  0   IAB  0   Ectopic  0   Multiple  0   Live Births  0        Obstetric Comments  1 adopted daughter          Family History  Problem Relation Age of Onset   Other Mother        PAD w/ hx left BKA    Heart attack Father        died age 4   Heart disease Brother        died age 70- CHF   Breast cancer Neg Hx      Social History   Tobacco Use   Smoking status: Never   Smokeless tobacco: Never  Vaping Use   Vaping Use: Never used  Substance Use Topics   Alcohol use: Yes    Alcohol/week: 1.0 standard drink    Types: 1 Standard drinks or equivalent per week    Comment: SOCIAL   Drug use: No    Home Medications Prior to Admission medications   Medication Sig Start Date End Date Taking? Authorizing Provider  benzocaine-resorcinol (VAGISIL) 5-2 % vaginal cream Place vaginally at bedtime.    [provider]  Calcium Citrate-Vitamin D (CITRACAL + D PO) Take by mouth. 315mg /200mg  strength; Take two tabs daily    [provider]  Cholecalciferol (VITAMIN D) 2000 UNITS tablet Take 2,000 Units by mouth daily.    [provider]  clobetasol cream (TEMOVATE) 0.94 % Apply 1 application topically daily. Thin layer on vulva daily 02/12/20   Princess Bruins, MD  COVID-19 mRNA bivalent vaccine, Pfizer, injection Inject into  the muscle. 12/19/20   Carlyle Basques, MD  COVID-19 mRNA Vac-TriS, Pfizer, (PFIZER-BIONT COVID-19 VAC-TRIS) SUSP injection Inject into the muscle. 07/02/20   Carlyle Basques, MD  COVID-19 mRNA vaccine, Tangent, 30 MCG/0.3ML injection INJECT AS DIRECTED 01/30/20 01/29/21  Carlyle Basques, MD  Multiple Vitamins-Minerals (MULTIVITAMIN WITH MINERALS) tablet Take 1 tablet by mouth daily.    [provider]  nystatin-triamcinolone ointment (MYCOLOG) Apply 1 application topically daily. 08/15/20   Princess Bruins, MD    Allergies    Beef-derived products  Review of Systems   Review of Systems  Constitutional:  Negative for fever.  Respiratory:  Negative for shortness of breath.   Cardiovascular:  Negative for chest pain.  Gastrointestinal:  Negative for abdominal distention and abdominal pain.  Musculoskeletal:  Positive for joint swelling.  Skin:  Positive for color change.  Neurological:  Negative for dizziness.   Physical Exam Updated Vital  Signs BP (!) 143/112   Pulse 91   Temp 98.2 F (36.8 C) (Oral)   Resp 16   Ht 5\' 10"  (1.778 m)   Wt 85.7 kg   SpO2 98%   BMI 27.12 kg/m   Physical Exam Constitutional:      Appearance: Normal appearance.  Eyes:     Conjunctiva/sclera: Conjunctivae normal.  Cardiovascular:     Rate and Rhythm: Normal rate and regular rhythm.     Pulses:          Dorsalis pedis pulses are 2+ on the right side and 2+ on the left side.       Posterior tibial pulses are 1+ on the right side and 1+ on the left side.     Heart sounds: Normal heart sounds.  Pulmonary:     Effort: Pulmonary effort is normal.     Breath sounds: Normal breath sounds.  Abdominal:     Palpations: Abdomen is soft.     Tenderness: There is no abdominal tenderness.  Musculoskeletal:     Right foot: Swelling present. Normal pulse.     Left foot: No swelling.  Feet:     Right foot:     Protective Sensation: 6 sites tested.  6 sites sensed.     Skin integrity: No ulcer, erythema or warmth.     Left foot:     Skin integrity: No ulcer, erythema or warmth.  Skin:    General: Skin is warm.     Coloration: Skin is mottled.  Neurological:     Mental Status: She is alert and oriented to person, place, and time.    ED Results / Procedures / Treatments   Labs (all labs ordered are listed, but only abnormal results are displayed) Labs Reviewed - No data to display  EKG None  Radiology US Venous Img Lower Unilateral Right  Result Date: 01/21/2021 CLINICAL DATA:  Right foot edema. EXAM: RIGHT LOWER EXTREMITY VENOUS DOPPLER ULTRASOUND TECHNIQUE: Gray-scale sonography with graded compression, as well as color Doppler and duplex ultrasound were performed to evaluate the lower extremity deep venous systems from the level of the common femoral vein and including the common femoral, femoral, profunda femoral, popliteal and calf veins including the posterior tibial, peroneal and gastrocnemius veins when visible. The superficial  great saphenous vein was also interrogated. Spectral Doppler was utilized to evaluate flow at rest and with distal augmentation maneuvers in the common femoral, femoral and popliteal veins. COMPARISON:  None. FINDINGS: Contralateral Common Femoral Vein: Respiratory phasicity is normal and symmetric with the symptomatic side. No evidence of  thrombus. Normal compressibility. Common Femoral Vein: No evidence of thrombus. Normal compressibility, respiratory phasicity and response to augmentation. Saphenofemoral Junction: No evidence of thrombus. Normal compressibility and flow on color Doppler imaging. Profunda Femoral Vein: No evidence of thrombus. Normal compressibility and flow on color Doppler imaging. Femoral Vein: No evidence of thrombus. Normal compressibility, respiratory phasicity and response to augmentation. Popliteal Vein: No evidence of thrombus. Normal compressibility, respiratory phasicity and response to augmentation. Calf Veins: No evidence of thrombus. Normal compressibility and flow on color Doppler imaging. Superficial Great Saphenous Vein: No evidence of thrombus. Normal compressibility. Venous Reflux:  None. Other Findings: No evidence of superficial thrombophlebitis or abnormal fluid collection. IMPRESSION: No evidence of right lower extremity deep venous thrombosis. Electronically Signed   By: Aletta Edouard M.D.   On: 01/21/2021 17:00   DG Foot Complete Right  Result Date: 01/21/2021 CLINICAL DATA:  Swelling of first through fifth metatarsals beginning 5 days ago. Prior surgery. EXAM: RIGHT FOOT COMPLETE - 3+ VIEW COMPARISON:  None. FINDINGS: Mild hallux valgus deformity. Prior distal first metatarsal osteotomy. Moderate underlying degenerative changes of the first metatarsophalangeal joint. No acute fracture or dislocation. No osseous destruction. Hammertoe deformities. Tiny Achilles spur. Suspect mild dorsal soft tissue swelling about the forefoot. IMPRESSION: Postsurgical and  degenerative changes, without acute osseous abnormality. Electronically Signed   By: Abigail Miyamoto M.D.   On: 01/21/2021 14:30    Procedures Procedures   Medications Ordered in ED Medications - No data to display  ED Course  I have reviewed the triage vital signs and the nursing notes.  Pertinent labs & imaging results that were available during my care of the patient were reviewed by me and considered in my medical decision making (see chart for details).    MDM Rules/Calculators/A&P                         Right foot swelling Radiographs negative for fracture or soft tissue pathology. Ultrasound negative for DVT. Patient has numerous varicose veins of the lower extremity and reports seeing a vein doctor for management. Patient has no chest pain, shortness of breath or heart disease that would raise concern for cardiac pathology such as heart failure. 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 Advised patient to continue wearing compression stockings, elevating feet as needed. Recommend follow up with her venous doctor.  Final Clinical Impression(s) / ED Diagnoses Final diagnoses:  Chronic venous insufficiency of lower extremity    Rx / DC Orders ED Discharge Orders     None        Tonye Pearson, PA-C 01/21/21 1736    Gareth Morgan, MD 01/22/21 2259

## 2021-01-21 NOTE — ED Triage Notes (Signed)
Pt c/o right foot swelling and "soreness" since last Thursday. Denies hx of DVT/gout, however family hx of both.

## 2021-03-04 ENCOUNTER — Encounter: Payer: Self-pay | Admitting: Physician Assistant

## 2021-03-06 ENCOUNTER — Ambulatory Visit: Payer: Medicare HMO | Admitting: Obstetrics & Gynecology

## 2021-03-11 ENCOUNTER — Other Ambulatory Visit: Payer: Self-pay | Admitting: *Deleted

## 2021-03-11 ENCOUNTER — Encounter: Payer: Self-pay | Admitting: Obstetrics & Gynecology

## 2021-03-11 ENCOUNTER — Other Ambulatory Visit: Payer: Self-pay

## 2021-03-11 ENCOUNTER — Ambulatory Visit (INDEPENDENT_AMBULATORY_CARE_PROVIDER_SITE_OTHER): Payer: Medicare HMO | Admitting: Obstetrics & Gynecology

## 2021-03-11 ENCOUNTER — Other Ambulatory Visit (HOSPITAL_COMMUNITY)
Admission: RE | Admit: 2021-03-11 | Discharge: 2021-03-11 | Disposition: A | Discharge status: Home / Self Care | Payer: Medicare HMO | Source: Ambulatory Visit | Attending: Obstetrics & Gynecology | Admitting: Obstetrics & Gynecology

## 2021-03-11 VITALS — BP 112/72 | HR 77

## 2021-03-11 DIAGNOSIS — Z1151 Encounter for screening for human papillomavirus (HPV): Secondary | ICD-10-CM | POA: Diagnosis not present

## 2021-03-11 DIAGNOSIS — Z01419 Encounter for gynecological examination (general) (routine) without abnormal findings: Secondary | ICD-10-CM | POA: Diagnosis present

## 2021-03-11 DIAGNOSIS — R87622 Low grade squamous intraepithelial lesion on cytologic smear of vagina (LGSIL): Secondary | ICD-10-CM | POA: Insufficient documentation

## 2021-03-11 DIAGNOSIS — Z8742 Personal history of other diseases of the female genital tract: Secondary | ICD-10-CM | POA: Diagnosis not present

## 2021-03-11 DIAGNOSIS — R87610 Atypical squamous cells of undetermined significance on cytologic smear of cervix (ASC-US): Secondary | ICD-10-CM | POA: Insufficient documentation

## 2021-03-11 DIAGNOSIS — I872 Venous insufficiency (chronic) (peripheral): Secondary | ICD-10-CM

## 2021-03-11 NOTE — Progress Notes (Signed)
° ° °  [place]y [place] 29-Mar-1945 [id number]        75 y.o.  G0  RP: LGSIL for repeat Pap test  HPI: Persistent LGSIL.  Last Pap with LGSIL still in 07/2020.  No pelvic pain.  CAIS. 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  HPV 16-18-45 were negative.    OB History  Gravida Para Term Preterm AB Living  0 0 0 0 0 0  SAB IAB Ectopic Multiple Live Births  0 0 0 0 0  Obstetric Comments  1 adopted daughter     Past medical history,surgical history, problem list, medications, allergies, family history and social history were all reviewed and documented in the EPIC chart.   Directed ROS with pertinent positives and negatives documented in the history of present illness/assessment and plan.  Exam:  Vitals:   03/11/21 1008  BP: 112/72  Pulse: 77  SpO2: 97%   General appearance:  Normal  Gynecologic exam: Vulva normal.  Narrow speculum use:  Short vagina.  Pap reflex done at the vaginal vault.   Assessment/Plan:  75 y.o. G0P0000   1. 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 H/O HR HPV positive in 2020.  Pap reflex done.  Management per results. - Cytology - PAP( [facility])  Other orders - omeprazole (PRILOSEC) 20 MG capsule; Take 20 mg by mouth every morning. - clotrimazole-betamethasone (LOTRISONE) lotion; 1 application to affected area - calcium carbonate (TUMS - DOSED IN MG ELEMENTAL CALCIUM) 500 MG chewable tablet; 1 tablet   Princess Bruins MD, 10:25 AM 03/11/2021

## 2021-03-14 LAB — CYTOLOGY - PAP
Comment: NEGATIVE
Diagnosis: UNDETERMINED — AB
High risk HPV: NEGATIVE

## 2021-03-17 ENCOUNTER — Encounter: Payer: Self-pay | Admitting: Obstetrics & Gynecology

## 2021-03-27 ENCOUNTER — Other Ambulatory Visit: Payer: Self-pay

## 2021-03-27 ENCOUNTER — Ambulatory Visit (INDEPENDENT_AMBULATORY_CARE_PROVIDER_SITE_OTHER): Payer: Medicare HMO | Admitting: Physician Assistant

## 2021-03-27 ENCOUNTER — Ambulatory Visit (HOSPITAL_COMMUNITY)
Admission: RE | Admit: 2021-03-27 | Discharge: 2021-03-27 | Disposition: A | Discharge status: Home / Self Care | Payer: Medicare HMO | Source: Ambulatory Visit | Attending: Vascular Surgery | Admitting: Vascular Surgery

## 2021-03-27 VITALS — BP 127/66 | HR 54 | Temp 97.6°F | Resp 14 | Ht 70.0 in | Wt 194.0 lb

## 2021-03-27 DIAGNOSIS — I872 Venous insufficiency (chronic) (peripheral): Secondary | ICD-10-CM | POA: Diagnosis not present

## 2021-03-27 NOTE — Progress Notes (Addendum)
[facility] [place]   Reason for referral: B LE varicose veins   History of Present Illness  Casey Woods is a 76 y.o. female who presents with chief complaint: tingling, numbness in her feet as well as varicose veins B LE.  Patient notes, onset years  ago, associated with no edema, no open wounds or weeping.  The patient has had no history of DVT, positive history of varicose vein, no history of venous stasis ulcers, no history of  Lymphedema and positive  history of skin changes in lower legs.  There is no family history of venous disorders.  The patient has  used compression stockings in the past and presently wears 20-30 mm HG.  She denise edema, weeping or ulcers.  She walks for exercise daily about a mile.  She has scaring on both ankle areas, without history of wounds.  Her complaint is numbness and tingling right foot > left over the past few weeks.  She denise claudication symptoms, or rest pain.    Past Medical History:  Diagnosis Date   Edema leg    left > right   Venous insufficiency (chronic) (peripheral)     Past Surgical History:  Procedure Laterality Date   ABDOMINAL HYSTERECTOMY     BUNIONECTOMY Bilateral    LASER ABLATION     right - varicose veins   LEG SURGERY      Social History   Socioeconomic History   Marital status: Divorced    Spouse name: Not on file   Number of children: Not on file   Years of education: Not on file   Highest education level: Not on file  Occupational History   Not on file  Tobacco Use   Smoking status: Never   Smokeless tobacco: Never  Vaping Use   Vaping Use: Never used  Substance and Sexual Activity   Alcohol use: Yes    Alcohol/week: 1.0 standard drink    Types: 1 Standard drinks or equivalent per week    Comment: SOCIAL   Drug use: No   Sexual activity: Yes    Partners: Male    Comment: 1ST INTERCOURSE- 31, PARTNERS- 5,  FIANCEE- 5 YRS   Other Topics Concern   Not on file  Social History  Narrative   Not on file   Social Determinants of Health   Financial Resource Strain: Not on file  Food Insecurity: Not on file  Transportation Needs: Not on file  Physical Activity: Not on file  Stress: Not on file  Social Connections: Not on file  Intimate Partner Violence: Not on file    Family History  Problem Relation Age of Onset   Other Mother        PAD w/ hx left BKA    Heart attack Father        died age 90   Heart disease Brother        died age 14- CHF   Breast cancer Neg Hx     Current Outpatient Medications on File Prior to Visit  Medication Sig Dispense Refill   calcium carbonate (TUMS - DOSED IN MG ELEMENTAL CALCIUM) 500 MG chewable tablet 1 tablet     Calcium Citrate-Vitamin D (CITRACAL + D PO) Take by mouth. 315mg /200mg  strength; Take two tabs daily     Cholecalciferol (VITAMIN D) 2000 UNITS tablet Take 2,000 Units by mouth daily.     clobetasol cream (TEMOVATE) 5.91 % Apply 1 application topically daily. Thin layer on  vulva daily 30 g 4   clotrimazole-betamethasone (LOTRISONE) lotion 1 application to affected area     COVID-19 mRNA bivalent vaccine, Pfizer, injection Inject into the muscle. 0.3 mL 0   COVID-19 mRNA Vac-TriS, Pfizer, (PFIZER-BIONT COVID-19 VAC-TRIS) SUSP injection Inject into the muscle. 0.3 mL 0   Multiple Vitamins-Minerals (MULTIVITAMIN WITH MINERALS) tablet Take 1 tablet by mouth daily.     nystatin-triamcinolone ointment (MYCOLOG) Apply 1 application topically daily. 30 g 4   omeprazole (PRILOSEC) 20 MG capsule Take 20 mg by mouth every morning.     benzocaine-resorcinol (VAGISIL) 5-2 % vaginal cream Place vaginally at bedtime. (Patient not taking: Reported on 03/27/2021)     No current facility-administered medications on file prior to visit.    Allergies as of 03/27/2021 - Review Complete 03/27/2021  Allergen Reaction Noted   Beef-derived products  01/17/2011     ROS:   General:  No weight loss, Fever, chills  HEENT: No recent  headaches, no nasal bleeding, no visual changes, no sore throat  Neurologic: No dizziness, blackouts, seizures. No recent symptoms of stroke or mini- stroke. No recent episodes of slurred speech, or temporary blindness.  Cardiac: No recent episodes of chest pain/pressure, no shortness of breath at rest.  No shortness of breath with exertion.  Denies history of atrial fibrillation or irregular heartbeat  Vascular: No history of rest pain in feet.  No history of claudication.  No history of non-healing ulcer, No history of DVT   Pulmonary: No home oxygen, no productive cough, no hemoptysis,  No asthma or wheezing  Musculoskeletal:  [ ]  Arthritis, [ ]  Low back pain,  [ ]  Joint pain  Hematologic:No history of hypercoagulable state.  No history of easy bleeding.  No history of anemia  Gastrointestinal: No hematochezia or melena,  No gastroesophageal reflux, no trouble swallowing  Urinary: [ ]  chronic Kidney disease, [ ]  on HD - [ ]  MWF or [ ]  TTHS, [ ]  Burning with urination, [ ]  Frequent urination, [ ]  Difficulty urinating;   Skin: No rashes  Psychological: No history of anxiety,  No history of depression  Physical Examination  Vitals:   03/27/21 1139  BP: 127/66  Pulse: (!) 54  Resp: 14  Temp: 97.6 F (36.4 C)  TempSrc: Temporal  SpO2: 97%  Weight: 194 lb (88 kg)  Height: 5\' 10"  (1.778 m)    Body mass index is 27.84 kg/m.  General:  Alert and oriented, no acute distress HEENT: Normal Neck: No bruit or JVD Pulmonary: Clear to auscultation bilaterally Cardiac: Regular Rate and Rhythm without murmur Abdomen: Soft, non-tender, non-distended, no mass, no scars Skin: No rash Extremity Pulses:  2+ radial, brachial, femoral, dorsalis pedis, posterior tibial pulses bilaterally Musculoskeletal: No deformity or edema  Neurologic: Upper and lower extremity motor 5/5 and symmetric  DATA:               Venous Reflux Times   +------------------+---------+------+-----------+------------+-------------  ----+   RIGHT              Reflux No Reflux Reflux Time Diameter cms Comments                                            Yes                                                 +------------------+---------+------+-----------+------------+-------------  ----+  CFV                           yes    >1 second                                    +------------------+---------+------+-----------+------------+-------------  ----+   FV mid                        yes    >1 second                                    +------------------+---------+------+-----------+------------+-------------  ----+   Popliteal                     yes    >1 second                                    +------------------+---------+------+-----------+------------+-------------  ----+   GSV at [facility]                    yes     >500 ms       0.66                          +------------------+---------+------+-----------+------------+-------------  ----+   GSV prox thigh     no                               0.23                          +------------------+---------+------+-----------+------------+-------------  ----+   GSV mid thigh      no                               0.26                          +------------------+---------+------+-----------+------------+-------------  ----+   GSV dist thigh                yes     >500 ms       0.30                          +------------------+---------+------+-----------+------------+-------------  ----+   GSV at knee                   yes     >500 ms       0.28     branches out  of                                                                   fasica to  feed  proximal  calf                                                                     varicosities          +------------------+---------+------+-----------+------------+-------------  ----+   GSV prox calf      no                               0.17     tortuous             +------------------+---------+------+-----------+------------+-------------  ----+   SSV Pop Fossa      no                               0.23                          +------------------+---------+------+-----------+------------+-------------  ----+   anterior accessory no                               0.37                          +------------------+---------+------+-----------+------------+-------------  ----+     Summary:  Right:  - No evidence of deep vein thrombosis from the common femoral through the  popliteal veins.  - No evidence of superficial venous thrombosis.  - Significant deep venous reflux is observed.  - The great saphenous vein is not comptent at the distal thigh and knee.  - The small saphenous vein is competent.    Assessment: Deep mixed with GSV reflux The venous reflux is throughout the deep system, GSJ and thigh to popliteal GSV reflux.  The size of the GSV is < 0.4 cm.   She denise edema in her LE  As far as the numbness and tingling over the past 2 weeks it maybe early peripheral neuropathy symptoms.  She denise DM and she has excellent 2+ palpable pedal pulses.  She is not at risk of limb loss.  She was seen by another vein specialist who suggested laser ablation treatment in early Dec 2022.  She is coming here for a second option.  She may benefit from vein stripping to prevent further skin changes in the future.   Plan: She has spider veins in her feet with lower leg varicose veins.  I want her to see one of our vein specialist to discuss her options.  In the mean time she will continue to wear her compression sock 20-30 mm hg daily and walking for exercise daily.  Roxy Horseman PA-C [facility] [place] Office: [phone]  MD in clinic [place]

## 2021-03-28 ENCOUNTER — Other Ambulatory Visit: Payer: Self-pay

## 2021-03-28 DIAGNOSIS — I872 Venous insufficiency (chronic) (peripheral): Secondary | ICD-10-CM

## 2021-04-01 ENCOUNTER — Other Ambulatory Visit: Payer: Self-pay | Admitting: *Deleted

## 2021-04-01 DIAGNOSIS — I872 Venous insufficiency (chronic) (peripheral): Secondary | ICD-10-CM

## 2021-04-01 NOTE — Progress Notes (Unsigned)
Patient appt

## 2021-04-03 ENCOUNTER — Other Ambulatory Visit: Payer: Self-pay

## 2021-04-03 ENCOUNTER — Ambulatory Visit (HOSPITAL_COMMUNITY)
Admission: RE | Admit: 2021-04-03 | Discharge: 2021-04-03 | Disposition: A | Discharge status: Home / Self Care | Payer: Medicare HMO | Source: Ambulatory Visit | Attending: Vascular Surgery | Admitting: Vascular Surgery

## 2021-04-03 DIAGNOSIS — I872 Venous insufficiency (chronic) (peripheral): Secondary | ICD-10-CM | POA: Insufficient documentation

## 2021-04-16 ENCOUNTER — Ambulatory Visit (INDEPENDENT_AMBULATORY_CARE_PROVIDER_SITE_OTHER): Payer: Medicare HMO | Admitting: Vascular Surgery

## 2021-04-16 ENCOUNTER — Encounter: Payer: Self-pay | Admitting: Vascular Surgery

## 2021-04-16 ENCOUNTER — Other Ambulatory Visit: Payer: Self-pay

## 2021-04-16 VITALS — BP 131/65 | HR 68 | Temp 98.2°F | Resp 18 | Ht 69.0 in | Wt 197.3 lb

## 2021-04-16 DIAGNOSIS — G609 Hereditary and idiopathic neuropathy, unspecified: Secondary | ICD-10-CM

## 2021-04-16 DIAGNOSIS — I872 Venous insufficiency (chronic) (peripheral): Secondary | ICD-10-CM

## 2021-04-16 NOTE — Progress Notes (Signed)
REASON FOR VISIT:   Follow-up of chronic venous insufficiency.  MEDICAL ISSUES:   CHRONIC VENOUS INSUFFICIENCY: This patient does have significant chronic venous insufficiency (CEAP C4 venous disease).  She has significant deep venous reflux bilaterally.  Currently I do not think she has significant superficial venous reflux.  We have discussed the importance of intermittent leg elevation and the proper positioning for this.  I have encouraged her to wear her knee-high compression stockings with a gradient of 15 to 20 mmHg.  I have encouraged her to avoid prolonged sitting and standing.  We have discussed the importance of exercise.  I think some of her symptoms can be attributed to her chronic venous insufficiency although her symptoms are also likely explained by some element of peripheral neuropathy.  She is due to be seen by neurology in February.  I did reassure her that she has no evidence of arterial insufficiency.  She has normal pulses bilaterally.  Although currently she is not a candidate for laser ablation of her superficial venous reflux on the right or superficial venous reflux in the distal saphenous vein on the left I have explained that in the future this could change if her venous disease progresses.  I be happy to see her back at any point in the future if her symptoms progress or her varicose veins worsen.   HPI:   Casey Woods is a pleasant 76 y.o. female who was seen by Laurence Slate, PA on 03/27/2021 with chronic venous insufficiency.  Her chief complaint was tingling and numbness in her feet.  In addition she had varicose veins bilaterally.  She had no previous history of DVT.  She was noted to have some varicose veins on exam in both legs as documented in the photograph on 03/27/2021 in her note.  Her duplex scans showed deep and superficial venous reflux.  It was felt that the numbness and tingling in her feet may be related to peripheral neuropathy.  She did have palpable  pedal pulses.  Of note she had been seen elsewhere by a vein specialist who had suggested laser ablation treatment in December of last year.  She came here for a second opinion.  She comes in today to discuss her options.  My history the patient describes some tingling in both feet which has been more significant on the left side.  She did have some swelling in the right foot 2 months ago and underwent a venous duplex scan which showed no evidence of DVT.  She really has not had much problems with swelling since that time.  She denies any previous history of DVT.  She has been wearing knee-high compression stockings.  She does not have much in the way of symptoms related to venous hypertension.  Specifically she does not describe significant aching pain or heaviness in her legs.  I do not get any history of claudication or rest pain.  Past Medical History:  Diagnosis Date   Edema leg    left > right   Venous insufficiency (chronic) (peripheral)     Family History  Problem Relation Age of Onset   Other Mother        PAD w/ hx left BKA    Heart attack Father        died age 27   Heart disease Brother        died age 45- CHF   Breast cancer Neg Hx     SOCIAL HISTORY: Social History  Tobacco Use   Smoking status: Never   Smokeless tobacco: Never  Substance Use Topics   Alcohol use: Yes    Alcohol/week: 1.0 standard drink    Types: 1 Standard drinks or equivalent per week    Comment: SOCIAL    Allergies  Allergen Reactions   Beef-Derived Products     headache    Current Outpatient Medications  Medication Sig Dispense Refill   calcium carbonate (TUMS - DOSED IN MG ELEMENTAL CALCIUM) 500 MG chewable tablet 1 tablet     Calcium Citrate-Vitamin D (CITRACAL + D PO) Take by mouth. 315mg /200mg  strength; Take two tabs daily     Cholecalciferol (VITAMIN D) 2000 UNITS tablet Take 2,000 Units by mouth daily.     clobetasol cream (TEMOVATE) 7.42 % Apply 1 application topically daily.  Thin layer on vulva daily 30 g 4   clotrimazole-betamethasone (LOTRISONE) lotion 1 application to affected area     COVID-19 mRNA bivalent vaccine, Pfizer, injection Inject into the muscle. 0.3 mL 0   COVID-19 mRNA Vac-TriS, Pfizer, (PFIZER-BIONT COVID-19 VAC-TRIS) SUSP injection Inject into the muscle. 0.3 mL 0   Multiple Vitamins-Minerals (MULTIVITAMIN WITH MINERALS) tablet Take 1 tablet by mouth daily.     nystatin-triamcinolone ointment (MYCOLOG) Apply 1 application topically daily. 30 g 4   omeprazole (PRILOSEC) 20 MG capsule Take 20 mg by mouth every morning.     benzocaine-resorcinol (VAGISIL) 5-2 % vaginal cream Place vaginally at bedtime. (Patient not taking: Reported on 03/27/2021)     No current facility-administered medications for this visit.    REVIEW OF SYSTEMS:  [X]  denotes positive finding, [ ]  denotes negative finding Cardiac  Comments:  Chest pain or chest pressure:    Shortness of breath upon exertion:    Short of breath when lying flat:    Irregular heart rhythm:        Vascular    Pain in calf, thigh, or hip brought on by ambulation:    Pain in feet at night that wakes you up from your sleep:     Blood clot in your veins:    Leg swelling:         Pulmonary    Oxygen at home:    Productive cough:     Wheezing:         Neurologic    Sudden weakness in arms or legs:     Sudden numbness in arms or legs:     Sudden onset of difficulty speaking or slurred speech:    Temporary loss of vision in one eye:     Problems with dizziness:         Gastrointestinal    Blood in stool:     Vomited blood:         Genitourinary    Burning when urinating:     Blood in urine:        Psychiatric    Major depression:         Hematologic    Bleeding problems:    Problems with blood clotting too easily:        Skin    Rashes or ulcers:        Constitutional    Fever or chills:     PHYSICAL EXAM:   Vitals:   04/16/21 1415  BP: 131/65  Pulse: 68  Resp: 18   Temp: 98.2 F (36.8 C)  TempSrc: Temporal  SpO2: 100%  Weight: 197 lb 4.8 oz (89.5 kg)  Height: 5\' 9"  (  1.753 m)    GENERAL: The patient is a well-nourished female, in no acute distress. The vital signs are documented above. CARDIAC: There is a regular rate and rhythm.  VASCULAR: I do not detect carotid bruits. She has palpable dorsalis pedis and posterior tibial pulses bilaterally. She has varicose veins bilaterally and hyperpigmentation bilaterally consistent with chronic venous insufficiency.     PULMONARY: There is good air exchange bilaterally without wheezing or rales. ABDOMEN: Soft and non-tender with normal pitched bowel sounds.  MUSCULOSKELETAL: There are no major deformities or cyanosis. NEUROLOGIC: No focal weakness or paresthesias are detected. SKIN: There are no ulcers or rashes noted. PSYCHIATRIC: The patient has a normal affect.  DATA:    VENOUS DUPLEX: I have reviewed the previous venous duplex scan.  The patient had a venous duplex scan of the right lower extremity on 03/27/2021.  He had a venous duplex scan of the left lower extremity on 04/03/2021.  RIGHT SIDE: On the right side there was no evidence of DVT.  There was deep venous reflux involving the common femoral vein, femoral vein, and popliteal vein.  There were some small segments of superficial venous reflux on the right in the distal thigh and knee but these did not appear to be significant given that the vein was quite small here.   On the left side, there was no evidence of DVT.  There was deep venous reflux involving the common femoral vein, femoral vein, and popliteal vein.  There was superficial venous reflux in the great saphenous vein from the knee to the mid calf.  Diameters ranged from 3.3 to 6 mm.  However the proximal vein down to the knee had been ablated previously.     Deitra Mayo [facility] [phone]

## 2021-05-20 ENCOUNTER — Ambulatory Visit (INDEPENDENT_AMBULATORY_CARE_PROVIDER_SITE_OTHER): Payer: Medicare HMO | Admitting: Neurology

## 2021-05-20 ENCOUNTER — Encounter: Payer: Self-pay | Admitting: Neurology

## 2021-05-20 VITALS — BP 135/64 | HR 66 | Ht 69.0 in | Wt 198.0 lb

## 2021-05-20 DIAGNOSIS — K219 Gastro-esophageal reflux disease without esophagitis: Secondary | ICD-10-CM | POA: Insufficient documentation

## 2021-05-20 DIAGNOSIS — R030 Elevated blood-pressure reading, without diagnosis of hypertension: Secondary | ICD-10-CM | POA: Insufficient documentation

## 2021-05-20 DIAGNOSIS — M545 Low back pain, unspecified: Secondary | ICD-10-CM | POA: Insufficient documentation

## 2021-05-20 DIAGNOSIS — R351 Nocturia: Secondary | ICD-10-CM | POA: Insufficient documentation

## 2021-05-20 DIAGNOSIS — M8580 Other specified disorders of bone density and structure, unspecified site: Secondary | ICD-10-CM | POA: Insufficient documentation

## 2021-05-20 DIAGNOSIS — E559 Vitamin D deficiency, unspecified: Secondary | ICD-10-CM | POA: Insufficient documentation

## 2021-05-20 DIAGNOSIS — Z872 Personal history of diseases of the skin and subcutaneous tissue: Secondary | ICD-10-CM | POA: Insufficient documentation

## 2021-05-20 DIAGNOSIS — R202 Paresthesia of skin: Secondary | ICD-10-CM

## 2021-05-20 DIAGNOSIS — E7800 Pure hypercholesterolemia, unspecified: Secondary | ICD-10-CM | POA: Insufficient documentation

## 2021-05-20 DIAGNOSIS — R7303 Prediabetes: Secondary | ICD-10-CM | POA: Insufficient documentation

## 2021-05-20 NOTE — Progress Notes (Signed)
Chief Complaint  Patient presents with   New Patient (Initial Visit)    Rm 14. Alone. NP/paper/[facility]Neuropathy. Pt states neuropathy started with swelling in right foot. Pt c/o numbness and tingling in bilateral feet. She recently began having a shooting pain and electric shock sensation in her left ankle. Pt reports she had a fall last Thursday, which injured right leg.      ASSESSMENT AND PLAN  Casey Woods is a 76 y.o. female   Bilateral lower extremity paresthesia  Mild length dependent sensory changes  Differentiation diagnosis including small fiber neuropathy, versus varicose vein related sensory changes,  Laboratory evaluation to rule out treatable etiology  Will hold off EMG nerve conduction study, she will contact clinic for worsening symptoms   DIAGNOSTIC DATA (LABS, IMAGING, TESTING) - I reviewed patient records, labs, notes, testing and imaging myself where available.   MEDICAL HISTORY:  Casey Woods is a 76 year old female, seen in request by her primary care physician Dr. Shelia Media, [facility] Jew, for evaluation of bilateral lower extremity paresthesia  I reviewed and summarized the referring note. PMHX. GERD  She had long history of gradual worsening bilateral lower extremity varicose vein, around August 2022, she noticed increased discomfort of lower extremity, swelling at the ankles, bilateral feet numbness tingling,  She was seen by pain specialist Dr. Scot Dock recently, venous Doppler study showed no evidence of DVT, deep venous reflux involving the common femoral vein, femoral vein, and popliteal vein, left appeared not to be significant  She is wearing compression sock, ambulate without any difficulties, mild chronic low back pain, continue to have intermittent bilateral plantar surface stinging sensation, but has improved compared to few months ago,  PHYSICAL EXAM:   Vitals:   05/20/21 1014  BP: 135/64  Pulse: 66   Weight: 198 lb (89.8 kg)  Height: 5\' 9"  (1.753 m)   Not recorded     Body mass index is 29.24 kg/m.  PHYSICAL EXAMNIATION:  Gen: NAD, conversant, well nourised, well groomed                     Cardiovascular: Regular rate rhythm, no peripheral edema, warm, nontender. Eyes: Conjunctivae clear without exudates or hemorrhage Neck: Supple, no carotid bruits. Pulmonary: Clear to auscultation bilaterally   NEUROLOGICAL EXAM:  MENTAL STATUS: Speech:    Speech is normal; fluent and spontaneous with normal comprehension.  Cognition:     Orientation to time, place and person     Normal recent and remote memory     Normal Attention span and concentration     Normal Language, naming, repeating,spontaneous speech     Fund of knowledge   CRANIAL NERVES: CN II: Visual fields are full to confrontation. Pupils are round equal and briskly reactive to light. CN III, IV, VI: extraocular movement are normal. No ptosis. CN V: Facial sensation is intact to light touch CN VII: Face is symmetric with normal eye closure  CN VIII: Hearing is normal to causal conversation. CN IX, X: Phonation is normal. CN XI: Head turning and shoulder shrug are intact  MOTOR: There is no pronator drift of out-stretched arms. Muscle bulk and tone are normal. Muscle strength is normal.  REFLEXES: Reflexes are 2+ and symmetric at the biceps, triceps, knees, and trace at ankles. Plantar responses are flexor.  SENSORY: Preserved bilateral toe proprioception and vibratory sensation, mildly decreased pinprick to ankle level  COORDINATION: There is no trunk or limb dysmetria noted.  GAIT/STANCE: Posture  is normal. Gait is steady with normal steps, base, arm swing, and turning. Heel and toe walking are normal. Tandem gait is normal.  Romberg is absent.  REVIEW OF SYSTEMS:  Full 14 system review of systems performed and notable only for as above All other review of systems were  negative.   ALLERGIES: Allergies  Allergen Reactions   Beef-Derived Products     headache    HOME MEDICATIONS: Current Outpatient Medications  Medication Sig Dispense Refill   calcium carbonate (TUMS - DOSED IN MG ELEMENTAL CALCIUM) 500 MG chewable tablet 1 tablet     Calcium Citrate-Vitamin D (CITRACAL + D PO) Take by mouth. 315mg /200mg  strength; Take two tabs daily     Cholecalciferol (VITAMIN D) 2000 UNITS tablet Take 2,000 Units by mouth daily.     clobetasol cream (TEMOVATE) 5.09 % Apply 1 application topically daily. Thin layer on vulva daily 30 g 4   clotrimazole-betamethasone (LOTRISONE) lotion 1 application to affected area     COVID-19 mRNA bivalent vaccine, Pfizer, injection Inject into the muscle. 0.3 mL 0   COVID-19 mRNA Vac-TriS, Pfizer, (PFIZER-BIONT COVID-19 VAC-TRIS) SUSP injection Inject into the muscle. 0.3 mL 0   Multiple Vitamins-Minerals (MULTIVITAMIN WITH MINERALS) tablet Take 1 tablet by mouth daily.     nystatin-triamcinolone ointment (MYCOLOG) Apply 1 application topically daily. 30 g 4   omeprazole (PRILOSEC) 20 MG capsule Take 20 mg by mouth every morning.     No current facility-administered medications for this visit.    PAST MEDICAL HISTORY: Past Medical History:  Diagnosis Date   Edema leg    left > right   Venous insufficiency (chronic) (peripheral)     PAST SURGICAL HISTORY: Past Surgical History:  Procedure Laterality Date   ABDOMINAL HYSTERECTOMY     BUNIONECTOMY Bilateral    LASER ABLATION     right - varicose veins   LEG SURGERY      FAMILY HISTORY: Family History  Problem Relation Age of Onset   Other Mother        PAD w/ hx left BKA    Heart attack Father        died age 25   Heart disease Brother        died age 66- CHF   Breast cancer Neg Hx     SOCIAL HISTORY: Social History   Socioeconomic History   Marital status: Divorced    Spouse name: Not on file   Number of children: Not on file   Years of education: Not  on file   Highest education level: Not on file  Occupational History   Not on file  Tobacco Use   Smoking status: Never   Smokeless tobacco: Never  Vaping Use   Vaping Use: Never used  Substance and Sexual Activity   Alcohol use: Yes    Alcohol/week: 1.0 standard drink    Types: 1 Standard drinks or equivalent per week    Comment: SOCIAL   Drug use: No   Sexual activity: Yes    Partners: Male    Comment: 1ST INTERCOURSE- 17, PARTNERS- 5,  FIANCEE- 5 YRS   Other Topics Concern   Not on file  Social History Narrative   Not on file   Social Determinants of Health   Financial Resource Strain: Not on file  Food Insecurity: Not on file  Transportation Needs: Not on file  Physical Activity: Not on file  Stress: Not on file  Social Connections: Not on file  Intimate Partner  Violence: Not on file      Marcial Pacas, M.D. Ph.D.  [facility] [address], [place], [place] [address] Ph: [phone] Fax: [phone]  CC:  Deland Pretty, MD [place],  [place] [address]  Deland Pretty, MD

## 2021-05-23 LAB — CBC WITH DIFFERENTIAL/PLATELET
Basophils Absolute: 0 10*3/uL (ref 0.0–0.2)
Basos: 1 %
EOS (ABSOLUTE): 0.2 10*3/uL (ref 0.0–0.4)
Eos: 3 %
Hematocrit: 36.2 % (ref 34.0–46.6)
Hemoglobin: 12.5 g/dL (ref 11.1–15.9)
Immature Grans (Abs): 0 10*3/uL (ref 0.0–0.1)
Immature Granulocytes: 0 %
Lymphocytes Absolute: 1.4 10*3/uL (ref 0.7–3.1)
Lymphs: 21 %
MCH: 29.6 pg (ref 26.6–33.0)
MCHC: 34.5 g/dL (ref 31.5–35.7)
MCV: 86 fL (ref 79–97)
Monocytes Absolute: 0.4 10*3/uL (ref 0.1–0.9)
Monocytes: 7 %
Neutrophils Absolute: 4.4 10*3/uL (ref 1.4–7.0)
Neutrophils: 68 %
Platelets: 270 10*3/uL (ref 150–450)
RBC: 4.22 x10E6/uL (ref 3.77–5.28)
RDW: 12.9 % (ref 11.7–15.4)
WBC: 6.4 10*3/uL (ref 3.4–10.8)

## 2021-05-23 LAB — RPR: RPR Ser Ql: NONREACTIVE

## 2021-05-23 LAB — COMPREHENSIVE METABOLIC PANEL
ALT: 27 IU/L (ref 0–32)
AST: 28 IU/L (ref 0–40)
Albumin/Globulin Ratio: 1.9 (ref 1.2–2.2)
Albumin: 4.1 g/dL (ref 3.7–4.7)
Alkaline Phosphatase: 75 IU/L (ref 44–121)
BUN/Creatinine Ratio: 22 (ref 12–28)
BUN: 18 mg/dL (ref 8–27)
Bilirubin Total: 0.2 mg/dL (ref 0.0–1.2)
CO2: 23 mmol/L (ref 20–29)
Calcium: 9.3 mg/dL (ref 8.7–10.3)
Chloride: 105 mmol/L (ref 96–106)
Creatinine, Ser: 0.82 mg/dL (ref 0.57–1.00)
Globulin, Total: 2.2 g/dL (ref 1.5–4.5)
Glucose: 95 mg/dL (ref 70–99)
Potassium: 4.1 mmol/L (ref 3.5–5.2)
Sodium: 141 mmol/L (ref 134–144)
Total Protein: 6.3 g/dL (ref 6.0–8.5)
eGFR: 75 mL/min/{1.73_m2} (ref >59)

## 2021-05-23 LAB — MULTIPLE MYELOMA PANEL, SERUM
Albumin SerPl Elph-Mcnc: 3.5 g/dL (ref 2.9–4.4)
Albumin/Glob SerPl: 1.3 (ref 0.7–1.7)
Alpha 1: 0.2 g/dL (ref 0.0–0.4)
Alpha2 Glob SerPl Elph-Mcnc: 0.7 g/dL (ref 0.4–1.0)
B-Globulin SerPl Elph-Mcnc: 1 g/dL (ref 0.7–1.3)
Gamma Glob SerPl Elph-Mcnc: 0.8 g/dL (ref 0.4–1.8)
Globulin, Total: 2.8 g/dL (ref 2.2–3.9)
IgA/Immunoglobulin A, Serum: 241 mg/dL (ref 64–422)
IgG (Immunoglobin G), Serum: 730 mg/dL (ref 586–1602)
IgM (Immunoglobulin M), Srm: 159 mg/dL (ref 26–217)

## 2021-05-23 LAB — VITAMIN B12: Vitamin B-12: >2000 pg/mL — ABNORMAL HIGH (ref 232–1245)

## 2021-05-23 LAB — TSH: TSH: 2.07 u[IU]/mL (ref 0.450–4.500)

## 2021-05-23 LAB — ANA W/REFLEX IF POSITIVE: Anti Nuclear Antibody (ANA): NEGATIVE

## 2021-05-23 LAB — SEDIMENTATION RATE: Sed Rate: 16 mm/hr (ref 0–40)

## 2021-05-23 LAB — C-REACTIVE PROTEIN: CRP: 4 mg/L (ref 0–10)

## 2021-05-23 LAB — HGB A1C W/O EAG: Hgb A1c MFr Bld: 5.9 % — ABNORMAL HIGH (ref 4.8–5.6)

## 2021-06-27 ENCOUNTER — Other Ambulatory Visit (HOSPITAL_COMMUNITY): Payer: Self-pay

## 2021-08-19 ENCOUNTER — Other Ambulatory Visit (HOSPITAL_COMMUNITY)
Admission: RE | Admit: 2021-08-19 | Discharge: 2021-08-19 | Disposition: A | Discharge status: Home / Self Care | Payer: Medicare HMO | Source: Ambulatory Visit | Attending: Obstetrics & Gynecology | Admitting: Obstetrics & Gynecology

## 2021-08-19 ENCOUNTER — Ambulatory Visit (INDEPENDENT_AMBULATORY_CARE_PROVIDER_SITE_OTHER): Payer: Medicare HMO | Admitting: Obstetrics & Gynecology

## 2021-08-19 ENCOUNTER — Encounter: Payer: Self-pay | Admitting: Obstetrics & Gynecology

## 2021-08-19 VITALS — BP 118/78 | HR 68 | Resp 16 | Ht 68.75 in | Wt 194.0 lb

## 2021-08-19 DIAGNOSIS — Z780 Asymptomatic menopausal state: Secondary | ICD-10-CM

## 2021-08-19 DIAGNOSIS — Z01419 Encounter for gynecological examination (general) (routine) without abnormal findings: Secondary | ICD-10-CM | POA: Insufficient documentation

## 2021-08-19 DIAGNOSIS — R87620 Atypical squamous cells of undetermined significance on cytologic smear of vagina (ASC-US): Secondary | ICD-10-CM

## 2021-08-19 DIAGNOSIS — Z1151 Encounter for screening for human papillomavirus (HPV): Secondary | ICD-10-CM | POA: Insufficient documentation

## 2021-08-19 DIAGNOSIS — Z9189 Other specified personal risk factors, not elsewhere classified: Secondary | ICD-10-CM | POA: Diagnosis not present

## 2021-08-19 DIAGNOSIS — Z1272 Encounter for screening for malignant neoplasm of vagina: Secondary | ICD-10-CM | POA: Diagnosis present

## 2021-08-19 DIAGNOSIS — B977 Papillomavirus as the cause of diseases classified elsewhere: Secondary | ICD-10-CM

## 2021-08-19 DIAGNOSIS — Z9289 Personal history of other medical treatment: Secondary | ICD-10-CM

## 2021-08-19 DIAGNOSIS — B372 Candidiasis of skin and nail: Secondary | ICD-10-CM

## 2021-08-19 DIAGNOSIS — L292 Pruritus vulvae: Secondary | ICD-10-CM

## 2021-08-19 MED ORDER — NYSTATIN 100000 UNIT/GM EX POWD: 1.0000 "application " | Freq: Two times a day (BID) | CUTANEOUS | 4 refills | Status: DC

## 2021-08-19 NOTE — Progress Notes (Signed)
Casey Woods [birth year] [id number]   History:    76 y.o.  G0 Engaged   RP:  Established patient presenting for annual gyn exam    HPI: CAIS. 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  Last Pap ASCUS/HPV HR Neg in 02/2021.  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  HPV 16-18-45 were negative.  Pap reflex today.  No pelvic pain.  Continued vulvar itching.  Urine/BMs wnl.  Breasts wnl except for rash between and under the breasts.  Mammo Neg 11/15/2020.  BMI 28.86.  Health Labs and Bone Density with Dr Shelia Media.  Colono 2020.   Past medical history,surgical history, family history and social history were all reviewed and documented in the EPIC chart.  Gynecologic History No LMP recorded. Patient has had a hysterectomy.  Obstetric History OB History  Gravida Para Term Preterm AB Living  0 0 0 0 0 0  SAB IAB Ectopic Multiple Live Births  0 0 0 0 0  Obstetric Comments  1 adopted daughter      ROS: A ROS was performed and pertinent positives and negatives are included in the history.  GENERAL: No fevers or chills. HEENT: No change in vision, no earache, sore throat or sinus congestion. NECK: No pain or stiffness. CARDIOVASCULAR: No chest pain or pressure. No palpitations. PULMONARY: No shortness of breath, cough or wheeze. GASTROINTESTINAL: No abdominal pain, nausea, vomiting or diarrhea, melena or bright red blood per rectum. GENITOURINARY: No urinary frequency, urgency, hesitancy or dysuria. MUSCULOSKELETAL: No joint or muscle pain, no back pain, no recent trauma. DERMATOLOGIC: No rash, no itching, no lesions. ENDOCRINE: No polyuria, polydipsia, no heat or cold intolerance. No recent change in weight. HEMATOLOGICAL: No anemia or easy bruising or bleeding. NEUROLOGIC: No headache, seizures, numbness, tingling or weakness. PSYCHIATRIC: No depression, no loss of interest in normal activity or change in sleep pattern.     Exam:   BP 118/78   Pulse 68   Resp 16   Ht 5' 8.75" (1.746 m)    Wt 194 lb (88 kg)   BMI 28.86 kg/m   Body mass index is 28.86 kg/m.  General appearance : Well developed well nourished female. No acute distress HEENT: Eyes: no retinal hemorrhage or exudates,  Neck supple, trachea midline, no carotid bruits, no thyroidmegaly Lungs: Clear to auscultation, no rhonchi or wheezes, or rib retractions  Heart: Regular rate and rhythm, no murmurs or gallops Breast:Examined in sitting and supine position were symmetrical in appearance, no palpable masses or tenderness,  no skin retraction, no nipple inversion, no nipple discharge, no skin discoloration, no axillary or supraclavicular lymphadenopathy.  Mild rash between and under the breasts. Abdomen: no palpable masses or tenderness, no rebound or guarding Extremities: no edema or skin discoloration or tenderness  Pelvic: Vulva: Mild erythema.               Vagina: Short vagina unchanged.  Pap reflex done.  Adnexa  Without masses or tenderness  Anus: Normal   Assessment/Plan:  76 y.o. female for annual exam   1. Encounter for Papanicolaou smear of vagina as part of routine gynecological examination CAIS. 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  Last Pap ASCUS/HPV HR Neg in 02/2021.  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  HPV 16-18-45 were negative.  Pap reflex today.  No pelvic pain.  Continued vulvar itching.  Urine/BMs wnl.  Breasts wnl except for rash between and under the breasts.  Mammo Neg 11/15/2020.  BMI 28.86.  Health Labs and Bone Density with Dr Shelia Media.  Colono 2020. - Cytology - PAP( [place])  2. Atypical squamous cell changes of undetermined significance (ASCUS) on vaginal cytology - Cytology - PAP( [place])  3. High risk HPV infection - Cytology - PAP( [place])  4. Postmenopause   5. Vulvar itching Continue with Mycolog.  6. Yeast infection of the skin Nystatin powder.  7. Personal history of other medical treatment  Other orders - mometasone (ELOCON) 0.1 % cream;  Apply 1 application. topically 2 (two) times daily. - Calcium Carbonate Antacid (TUMS PO); Take by mouth. - CALCIUM PO; Take by mouth. - VITAMIN D PO; Take 1,000 Int'l Units by mouth. - UNABLE TO FIND; Med Name: vein formula '600mg'$  - acetaminophen (TYLENOL) 500 MG tablet; Take 500 mg by mouth every 6 (six) hours as needed. - TRIAMCINOLONE ACETONIDE EX; Apply topically. 0.1% - nystatin powder; Apply 1 application. topically 2 (two) times daily. Apply to affected skin   Princess Bruins MD, 1:41 PM 08/19/2021

## 2021-08-20 ENCOUNTER — Encounter: Payer: Self-pay | Admitting: Obstetrics & Gynecology

## 2021-08-25 MED ORDER — NYSTATIN-TRIAMCINOLONE 100000-0.1 UNIT/GM-% EX OINT: 1.0000 "application " | TOPICAL_OINTMENT | Freq: Every day | CUTANEOUS | 4 refills | Status: DC

## 2021-08-25 NOTE — Addendum Note (Signed)
Addended by: Princess Bruins on: 08/25/2021 01:58 PM   Modules accepted: Orders

## 2021-08-27 LAB — CYTOLOGY - PAP
Comment: NEGATIVE
Diagnosis: UNDETERMINED — AB
High risk HPV: NEGATIVE

## 2021-11-11 ENCOUNTER — Other Ambulatory Visit: Payer: Self-pay | Admitting: Internal Medicine

## 2021-11-11 DIAGNOSIS — Z1231 Encounter for screening mammogram for malignant neoplasm of breast: Secondary | ICD-10-CM

## 2021-11-28 ENCOUNTER — Ambulatory Visit
Admission: RE | Admit: 2021-11-28 | Discharge: 2021-11-28 | Disposition: A | Discharge status: Home / Self Care | Payer: Medicare HMO | Source: Ambulatory Visit | Attending: Internal Medicine | Admitting: Internal Medicine

## 2021-11-28 DIAGNOSIS — Z1231 Encounter for screening mammogram for malignant neoplasm of breast: Secondary | ICD-10-CM

## 2022-02-02 ENCOUNTER — Other Ambulatory Visit: Payer: Self-pay | Admitting: Gastroenterology

## 2022-02-02 DIAGNOSIS — K219 Gastro-esophageal reflux disease without esophagitis: Secondary | ICD-10-CM

## 2022-02-06 ENCOUNTER — Ambulatory Visit
Admission: RE | Admit: 2022-02-06 | Discharge: 2022-02-06 | Disposition: A | Discharge status: Home / Self Care | Payer: Medicare HMO | Source: Ambulatory Visit | Attending: Gastroenterology | Admitting: Gastroenterology

## 2022-02-06 DIAGNOSIS — K219 Gastro-esophageal reflux disease without esophagitis: Secondary | ICD-10-CM

## 2022-09-02 ENCOUNTER — Encounter: Payer: Self-pay | Admitting: Obstetrics & Gynecology

## 2022-09-02 ENCOUNTER — Other Ambulatory Visit (HOSPITAL_COMMUNITY)
Admission: RE | Admit: 2022-09-02 | Discharge: 2022-09-02 | Disposition: A | Discharge status: Home / Self Care | Payer: Medicare HMO | Source: Ambulatory Visit | Attending: Obstetrics & Gynecology | Admitting: Obstetrics & Gynecology

## 2022-09-02 ENCOUNTER — Ambulatory Visit (INDEPENDENT_AMBULATORY_CARE_PROVIDER_SITE_OTHER): Payer: Medicare HMO | Admitting: Obstetrics & Gynecology

## 2022-09-02 VITALS — BP 120/80 | HR 67 | Ht 68.75 in | Wt 192.0 lb

## 2022-09-02 DIAGNOSIS — L292 Pruritus vulvae: Secondary | ICD-10-CM

## 2022-09-02 DIAGNOSIS — Z780 Asymptomatic menopausal state: Secondary | ICD-10-CM | POA: Diagnosis not present

## 2022-09-02 DIAGNOSIS — R87620 Atypical squamous cells of undetermined significance on cytologic smear of vagina (ASC-US): Secondary | ICD-10-CM

## 2022-09-02 DIAGNOSIS — Z1272 Encounter for screening for malignant neoplasm of vagina: Secondary | ICD-10-CM | POA: Diagnosis not present

## 2022-09-02 DIAGNOSIS — Z9189 Other specified personal risk factors, not elsewhere classified: Secondary | ICD-10-CM

## 2022-09-02 DIAGNOSIS — Z01419 Encounter for gynecological examination (general) (routine) without abnormal findings: Secondary | ICD-10-CM | POA: Insufficient documentation

## 2022-09-02 DIAGNOSIS — Z1151 Encounter for screening for human papillomavirus (HPV): Secondary | ICD-10-CM | POA: Insufficient documentation

## 2022-09-02 DIAGNOSIS — Z9289 Personal history of other medical treatment: Secondary | ICD-10-CM

## 2022-09-02 MED ORDER — NYSTATIN-TRIAMCINOLONE 100000-0.1 UNIT/GM-% EX OINT: 1.0000 | TOPICAL_OINTMENT | Freq: Every day | CUTANEOUS | 5 refills | Status: AC

## 2022-09-02 NOTE — Progress Notes (Signed)
Casey Woods 02/01/1946 [id number]   History:    77 y.o. G0 Engaged.  Adopted daughter coming back from [place] to live in [place].   RP:  Established patient presenting for annual gyn exam    HPI: CAIS. 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  Last Pap ASCUS/HPV HR Neg in 07/2021.  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  HPV 16-18-45 were negative.  Pap/HPV HR today.  No pelvic pain.  Continued vulvar itching, helped by the Mycolog ointment, prescribed.  Urine/BMs wnl.  Breasts wnl except for rash between and under the breasts for which she uses Mycolog as well.  Mammo Neg 11/2021.  BMI 28.56.  Health Labs and Bone Density with Dr Renne Crigler.  Colono 2020.   Past medical history,surgical history, family history and social history were all reviewed and documented in the EPIC chart.  Gynecologic History No LMP recorded. Patient has had a hysterectomy.  Obstetric History OB History  Gravida Para Term Preterm AB Living  0 0 0 0 0 0  SAB IAB Ectopic Multiple Live Births  0 0 0 0 0  Obstetric Comments  1 adopted daughter      ROS: A ROS was performed and pertinent positives and negatives are included in the history. GENERAL: No fevers or chills. HEENT: No change in vision, no earache, sore throat or sinus congestion. NECK: No pain or stiffness. CARDIOVASCULAR: No chest pain or pressure. No palpitations. PULMONARY: No shortness of breath, cough or wheeze. GASTROINTESTINAL: No abdominal pain, nausea, vomiting or diarrhea, melena or bright red blood per rectum. GENITOURINARY: No urinary frequency, urgency, hesitancy or dysuria. MUSCULOSKELETAL: No joint or muscle pain, no back pain, no recent trauma. DERMATOLOGIC: No rash, no itching, no lesions. ENDOCRINE: No polyuria, polydipsia, no heat or cold intolerance. No recent change in weight. HEMATOLOGICAL: No anemia or easy bruising or bleeding. NEUROLOGIC: No headache, seizures, numbness, tingling or weakness. PSYCHIATRIC: No depression, no loss of  interest in normal activity or change in sleep pattern.     Exam:   BP 120/80   Pulse 67   Ht 5' 8.75" (1.746 m)   Wt 192 lb (87.1 kg)   SpO2 97%   BMI 28.56 kg/m   Body mass index is 28.56 kg/m.  General appearance : Well developed well nourished female. No acute distress HEENT: Eyes: no retinal hemorrhage or exudates,  Neck supple, trachea midline, no carotid bruits, no thyroidmegaly Lungs: Clear to auscultation, no rhonchi or wheezes, or rib retractions  Heart: Regular rate and rhythm, no murmurs or gallops Breast:Examined in sitting and supine position were symmetrical in appearance, no palpable masses or tenderness,  no skin retraction, no nipple inversion, no nipple discharge, no skin discoloration, no axillary or supraclavicular lymphadenopathy Abdomen: no palpable masses or tenderness, no rebound or guarding Extremities: no edema or skin discoloration or tenderness  Pelvic: Vulva: Normal             Vagina: No gross lesions or discharge.  Short vagina.  Pap/HPV HR done at the vaginal vault.             Adnexa  Without masses or tenderness  Anus: Normal   Assessment/Plan:  77 y.o. female for annual exam   1. Encounter for Papanicolaou smear of vagina as part of routine gynecological examination CAIS. 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  Last Pap ASCUS/HPV HR Neg in 07/2021.  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  HPV 16-18-45 were negative.  Pap/HPV HR today.  No pelvic pain.  Continued vulvar itching, helped by the Mycolog ointment, prescribed.  Urine/BMs wnl.  Breasts wnl except for rash between and under the breasts for which she uses Mycolog as well.  Mammo Neg 11/2021.  BMI 28.56.  Health Labs and Bone Density with Dr Renne Crigler.  [place]ono 2020. - Cytology - PAP( [place])  2. Atypical squamous cell changes of undetermined significance (ASCUS) on vaginal cytology - Cytology - PAP( [place])  3. Postmenopause CAIS. 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    4. Vulvar itching Continued vulvar itching, helped by the Mycolog ointment, prescribed.   5. Personal history of other medical treatment  Other orders - nystatin-triamcinolone ointment (MYCOLOG); Apply 1 Application topically daily. - Vonoprazan Fumarate (VOQUEZNA) 20 MG TABS; Take by mouth.   Genia Del MD, 11:28 AM

## 2022-09-04 LAB — CYTOLOGY - PAP
Comment: NEGATIVE
High risk HPV: NEGATIVE

## 2022-09-18 ENCOUNTER — Other Ambulatory Visit (HOSPITAL_COMMUNITY): Payer: Self-pay | Admitting: Gastroenterology

## 2022-09-18 DIAGNOSIS — K2100 Gastro-esophageal reflux disease with esophagitis, without bleeding: Secondary | ICD-10-CM

## 2022-09-18 DIAGNOSIS — R1111 Vomiting without nausea: Secondary | ICD-10-CM

## 2022-09-29 ENCOUNTER — Other Ambulatory Visit (HOSPITAL_COMMUNITY): Payer: Self-pay | Admitting: Registered Nurse

## 2022-09-29 DIAGNOSIS — E7800 Pure hypercholesterolemia, unspecified: Secondary | ICD-10-CM

## 2022-10-02 ENCOUNTER — Ambulatory Visit (HOSPITAL_COMMUNITY)
Admission: RE | Admit: 2022-10-02 | Discharge: 2022-10-02 | Disposition: A | Discharge status: Home / Self Care | Payer: Medicare HMO | Source: Ambulatory Visit | Attending: Gastroenterology | Admitting: Gastroenterology

## 2022-10-02 DIAGNOSIS — R1111 Vomiting without nausea: Secondary | ICD-10-CM | POA: Diagnosis present

## 2022-10-02 DIAGNOSIS — K2100 Gastro-esophageal reflux disease with esophagitis, without bleeding: Secondary | ICD-10-CM | POA: Insufficient documentation

## 2022-10-02 MED ORDER — TECHNETIUM TC 99M SULFUR COLLOID
2.2000 | Freq: Once | INTRAVENOUS | Status: AC | PRN
  Administered 2022-10-02: 2.2 via INTRAVENOUS

## 2022-10-12 ENCOUNTER — Ambulatory Visit (HOSPITAL_BASED_OUTPATIENT_CLINIC_OR_DEPARTMENT_OTHER)
Admission: RE | Admit: 2022-10-12 | Discharge: 2022-10-12 | Disposition: A | Discharge status: Home / Self Care | Payer: Medicare HMO | Source: Ambulatory Visit | Attending: Registered Nurse | Admitting: Registered Nurse

## 2022-10-12 DIAGNOSIS — E7800 Pure hypercholesterolemia, unspecified: Secondary | ICD-10-CM | POA: Insufficient documentation

## 2022-10-19 ENCOUNTER — Other Ambulatory Visit (HOSPITAL_BASED_OUTPATIENT_CLINIC_OR_DEPARTMENT_OTHER): Payer: Self-pay | Admitting: Internal Medicine

## 2022-10-19 DIAGNOSIS — R9389 Abnormal findings on diagnostic imaging of other specified body structures: Secondary | ICD-10-CM

## 2022-10-29 ENCOUNTER — Ambulatory Visit (HOSPITAL_BASED_OUTPATIENT_CLINIC_OR_DEPARTMENT_OTHER)
Admission: RE | Admit: 2022-10-29 | Discharge: 2022-10-29 | Disposition: A | Discharge status: Home / Self Care | Payer: Medicare HMO | Source: Ambulatory Visit | Attending: Internal Medicine | Admitting: Internal Medicine

## 2022-10-29 DIAGNOSIS — R9389 Abnormal findings on diagnostic imaging of other specified body structures: Secondary | ICD-10-CM | POA: Diagnosis present

## 2022-10-29 MED ORDER — IOHEXOL 300 MG/ML  SOLN
100.0000 mL | Freq: Once | INTRAMUSCULAR | Status: AC | PRN
  Administered 2022-10-29: 75 mL via INTRAVENOUS

## 2022-11-03 ENCOUNTER — Other Ambulatory Visit: Payer: Self-pay | Admitting: Obstetrics & Gynecology

## 2022-11-04 ENCOUNTER — Telehealth: Payer: Self-pay

## 2022-11-04 NOTE — Telephone Encounter (Signed)
Former ML pt LVM in triage line stating that she needs refills on mycolog ointment.  Rx sent by ML on 09/02/2022 for 30g tube w/ 5 additional refills.   Confirmed pharmacy received rx sent in 08/2022. They stated that they will get rx ready for pt.  Spoke w/ pt and confirmed with her the new rx process and the inability to use an automated system or an app for a new rx since it is usually generated a new rx #. Pt advised pharmacy is getting rx ready for her. She voiced understanding and appreciation for call. Routing to provider for final review and closing encounter.

## 2022-11-04 NOTE — Telephone Encounter (Signed)
Med refill request:Mycolog ointment Last AEX: 09/02/22 -ML Next AEX: Not scheduled.   Per review of OV notes dated 09/02/22, Mycolog cream for vulvar itching. RX sent 09/02/22 for 30 g/5RF  Rx refused. Refill not appropriate.   Routing to covering provider for final review.  Will close encounter.

## 2022-11-10 NOTE — Progress Notes (Unsigned)
Cardiology Office Note:  .   Date:  11/11/2022 ID:  Casey Woods, DOB Jan 10, 1946, MRN [id number] PCP: Merri Brunette, MD [facility] Providers Cardiologist:  Dr. Chilton Si    Patient Profile: .      PMH CAD Elevated coronary calcium score of 545 (87th percentile) on CT calcium score 10/13/22 Dyslipidemia GERD Obesity Heart murmur (known)       History of Present Illness: .   Casey Woods is a very pleasant 77 y.o. female  who is here today for new patient consult for elevated coronary calcium score on CT 10/13/22.  CT reveals CAC score of 545 (87th percentile), with the majority of the calcium located in LAD and LM.  We discussed these findings in detail. She reports she is active daily with walking, [facility]  She denies any specific cardiac symptoms.  No palpitations, chest pain, shortness of breath, dyspnea, orthopnea, PND, presyncope, syncope. Family history is not significant for CAD. Overall, she thinks her diet is healthy but admits she does love pork.   Family history: Mother lived to age 32, no cardiac issues but unsure about cholesterol Father died before age 74, was an alcoholic Older sister still living, age 55  Brother died from sepsis, early 32s  ASCVD Risk Score: 12.8%  Diet: No beef Overall healthy diet, admits she loves pork   Activity: [facility] [employer]  ROS: See HPI       Studies Reviewed: .         Risk Assessment/Calculations:         Physical Exam:    BP 128/62   Pulse (!) 52   Ht 5\' 8"  (1.727 m)   Wt 191 lb 1.6 oz (86.7 kg)   BMI 29.06 kg/m     GEN: Well nourished, well developed in no acute distress NECK: No JVD; No carotid bruits CARDIAC: RRR, slight systolic murmur. No rubs, gallops RESPIRATORY:  Clear to auscultation without rales, wheezing or rhonchi  ABDOMEN: Soft, non-tender, non-distended EXTREMITIES:  No edema; No deformity     ASSESSMENT AND PLAN:  .    Coronary artery calcification on CT: CAC score 545 (87th percentile) with distribution as follows: left main 177, LAD 357, LCx 0, RCA 11. She is very active and denies symptoms concerning for angina.  Consideration given to stress testing. Will continue to monitor for symptoms and consider at next office visit. We will start moderate dose rosuvastatin per her preference. No recommendation to start aspirin as she is sensitive to symptoms of acid reflux.   CV Risk Assessment: ASCVD Risk calculator 12.8%. We reviewed the components of her elevated ASCVD risk score and ways to reduce risk.  As noted below, we will start statin therapy for elevated LDL.   Dyslipidemia LDL goal < 70: LDL 140 on 4/69/62. Lengthy discussion about statin therapy and reducing overall CV risk.  She would like to start with moderate intensity statin as she is very hesitant to take medications. We will start rosuvastatin 10 mg daily.  Encouraged heart healthy diet low in saturated fat, processed foods, sugar, and simple carbohydrates.   Murmur: Soft systolic murmur noted on exam. She reports she has known about this but does not know a particular diagnosis. No echocardiogram to review. She is asymptomatic.    Activity/Diet goals: Continue regular exercise for goal of 150 minutes moderate intensity exercise each week. Nutrition information provided. Continue heart  healthy diet.       Dispo: 2-3 months with me with fasting labs prior  Signed, Eligha Bridegroom, NP-C

## 2022-11-11 ENCOUNTER — Encounter (HOSPITAL_BASED_OUTPATIENT_CLINIC_OR_DEPARTMENT_OTHER): Payer: Self-pay | Admitting: Nurse Practitioner

## 2022-11-11 ENCOUNTER — Ambulatory Visit (INDEPENDENT_AMBULATORY_CARE_PROVIDER_SITE_OTHER): Payer: Medicare HMO | Admitting: Nurse Practitioner

## 2022-11-11 VITALS — BP 128/62 | HR 52 | Ht 68.0 in | Wt 191.1 lb

## 2022-11-11 DIAGNOSIS — I2510 Atherosclerotic heart disease of native coronary artery without angina pectoris: Secondary | ICD-10-CM

## 2022-11-11 DIAGNOSIS — R011 Cardiac murmur, unspecified: Secondary | ICD-10-CM | POA: Diagnosis not present

## 2022-11-11 DIAGNOSIS — E785 Hyperlipidemia, unspecified: Secondary | ICD-10-CM | POA: Diagnosis not present

## 2022-11-11 DIAGNOSIS — Z7189 Other specified counseling: Secondary | ICD-10-CM | POA: Diagnosis not present

## 2022-11-11 MED ORDER — ROSUVASTATIN CALCIUM 10 MG PO TABS: 10.0000 mg | ORAL_TABLET | Freq: Every day | ORAL | 3 refills | Status: DC

## 2022-11-11 NOTE — Patient Instructions (Signed)
Medication Instructions:  START ROSUVASTATIN 10 MG DAILY   *If you need a refill on your cardiac medications before your next appointment, please call your pharmacy*  Lab Work: FASTING LABS 2-3 MONTHS ABOUT A WEEK PRIOR TO FOLLOW UP   If you have labs (blood work) drawn today and your tests are completely normal, you will receive your results only by: MyChart Message (if you have MyChart) OR A paper copy in the mail If you have any lab test that is abnormal or we need to change your treatment, we will call you to review the results.  Testing/Procedures: NONE  Follow-Up: At [facility], you and your health needs are our priority.  As part of our continuing mission to provide you with exceptional heart care, we have created designated Provider Care Teams.  These Care Teams include your primary Cardiologist (physician) and Advanced Practice Providers (APPs -  Physician Assistants and Nurse Practitioners) who all work together to provide you with the care you need, when you need it.  We recommend signing up for the patient portal called "MyChart".  Sign up information is provided on this After Visit Summary.  MyChart is used to connect with patients for Virtual Visits (Telemedicine).  Patients are able to view lab/test results, encounter notes, upcoming appointments, etc.  Non-urgent messages can be sent to your provider as well.   To learn more about what you can do with MyChart, go to [email or web address].    Your next appointment:   2-3 month(s)  Provider:   Eligha Bridegroom, NP    Other Instructions Adopting a Healthy Lifestyle.   Weight: Know what a healthy weight is for you (roughly BMI <25) and aim to maintain this. You can calculate your body mass index on your smart phone. Unfortunately, this is not the most accurate measure of healthy weight, but it is the simplest measurement to use. A more accurate measurement involves body scanning which measures lean muscle, fat  tissue and bony density. We do not have this equipment at [facility].    Diet: Aim for 7+ servings of fruits and vegetables daily Limit animal fats in diet for cholesterol and heart health - choose grass fed whenever available Avoid highly processed foods (fast food burgers, tacos, fried chicken, pizza, hot dogs, french fries)  Saturated fat comes in the form of butter, lard, coconut oil, margarine, partially hydrogenated oils, and fat in meat. These increase your risk of cardiovascular disease.  Use healthy plant oils, such as olive, canola, soy, corn, sunflower and peanut.  Whole foods such as fruits, vegetables and whole grains have fiber  Men need > 38 grams of fiber per day Women need > 25 grams of fiber per day  Load up on vegetables and fruits - one-half of your plate: Aim for color and variety, and remember that potatoes dont count. Go for whole grains - one-quarter of your plate: Whole wheat, barley, wheat berries, quinoa, oats, brown rice, and foods made with them. If you want pasta, go with whole wheat pasta. Protein power - one-quarter of your plate: Fish, chicken, beans, and nuts are all healthy, versatile protein sources. Limit red meat. You need carbohydrates for energy! The type of carbohydrate is more important than the amount. Choose carbohydrates such as vegetables, fruits, whole grains, beans, and nuts in the place of white rice, white pasta, potatoes (baked or fried), macaroni and cheese, cakes, cookies, and donuts.  If youre thirsty, drink water. Coffee and tea are good in moderation, but  skip sugary drinks and limit milk and dairy products to one or two daily servings. Keep sugar intake at 6 teaspoons or 24 grams or LESS       Exercise: Aim for 150 min of moderate intensity exercise weekly for heart health, and weights twice weekly for bone health Stay active - any steps are better than no steps! Aim for 7-9 hours of sleep daily

## 2022-12-03 ENCOUNTER — Telehealth: Payer: Self-pay | Admitting: Cardiovascular Disease

## 2022-12-03 NOTE — Telephone Encounter (Signed)
Pt c/o medication issue:  1. Name of Medication:   rosuvastatin (CRESTOR) 10 MG tablet    2. How are you currently taking this medication (dosage and times per day)?   Take 1 tablet (10 mg total) by mouth daily.    3. Are you having a reaction (difficulty breathing--STAT)? No  4. What is your medication issue? Patient states she is having a bad reaction from this medication. Patient states she is feeling fatigue and her joints hurt. Patient started this medication on 11/11/22. Patient stated she last took this medication yesterday, but she is now stopping the medication. Please advise.

## 2022-12-03 NOTE — Telephone Encounter (Signed)
Returned call to patient, advised patient take a two week break from meds and then provide an update on symptoms. Patient is agreeable and verbalizes understanding.

## 2023-01-16 IMAGING — MG MM DIGITAL SCREENING BILAT W/ TOMO AND CAD
8 series · 8 of 24 positions shown · non-contrast
Comparison: Previous exam(s).

CLINICAL DATA: Screening.

EXAM:
DIGITAL SCREENING BILATERAL MAMMOGRAM WITH TOMOSYNTHESIS AND CAD
TECHNIQUE: Bilateral screening digital craniocaudal and mediolateral oblique
mammograms were obtained. Bilateral screening digital breast
tomosynthesis was performed. The images were evaluated with
computer-aided detection.

[R CC synth-2D]
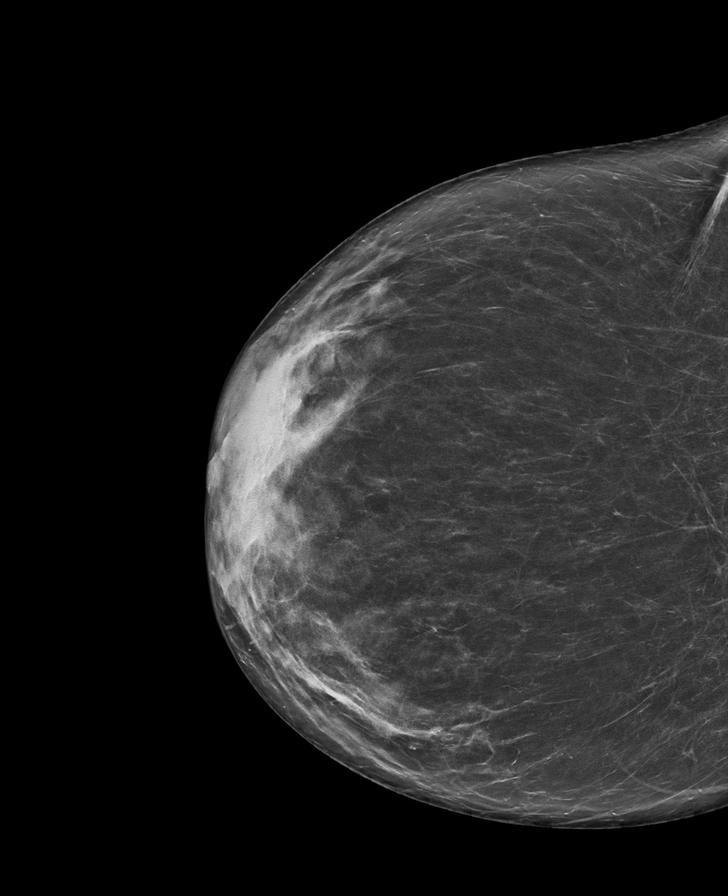

[R MLO synth-2D]
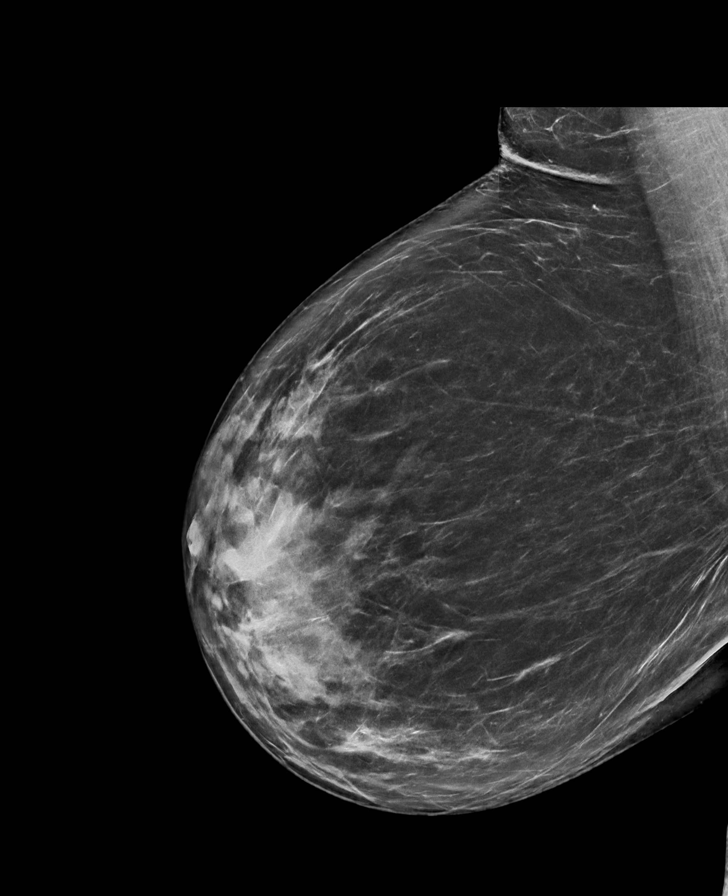

[L MLO synth-2D]
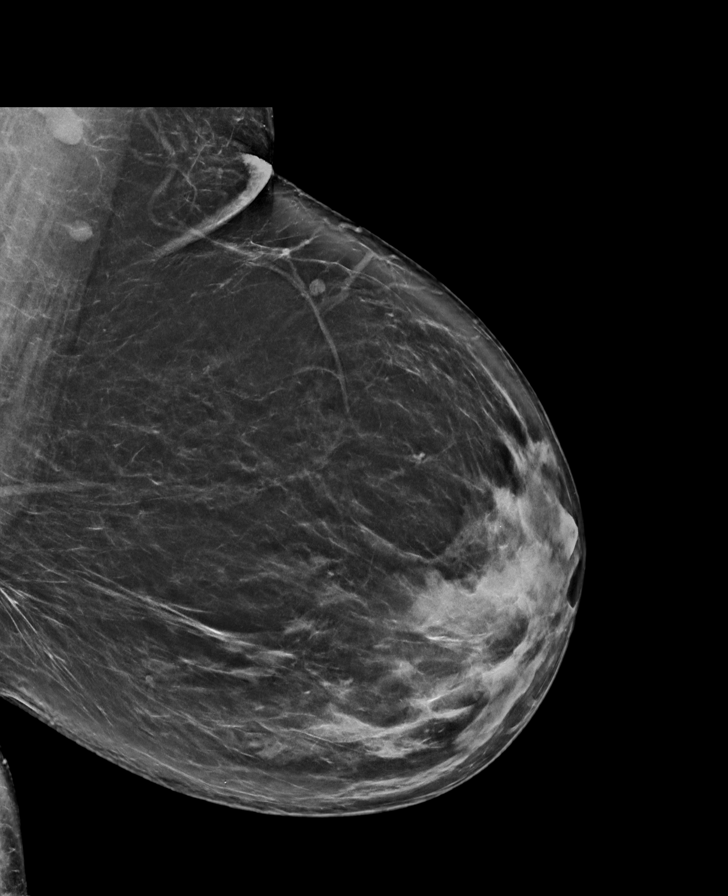

[L CC synth-2D]
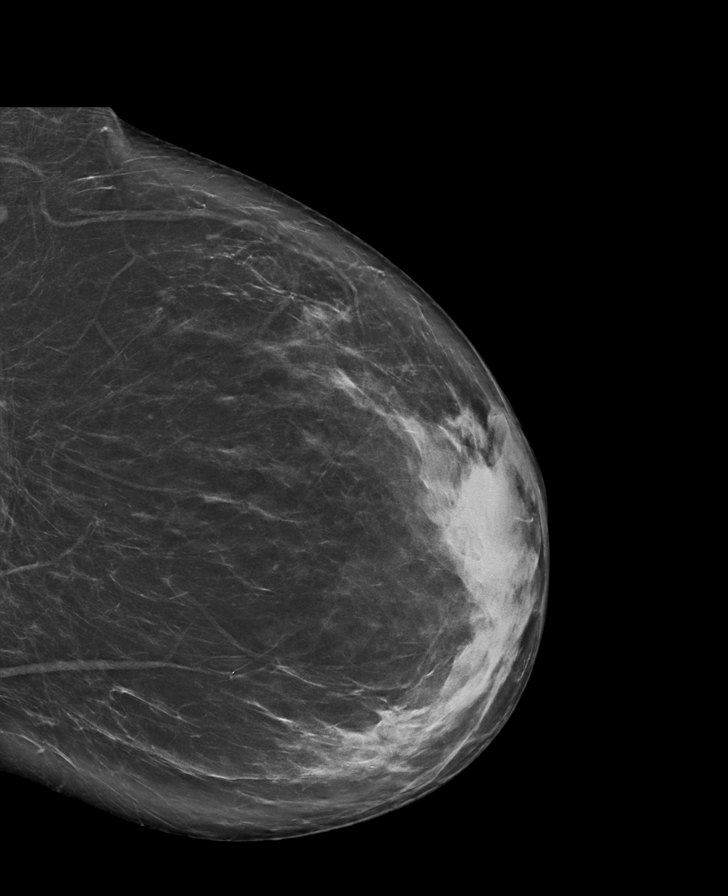

[R MLO tomo · tomo slice 45/89.0]
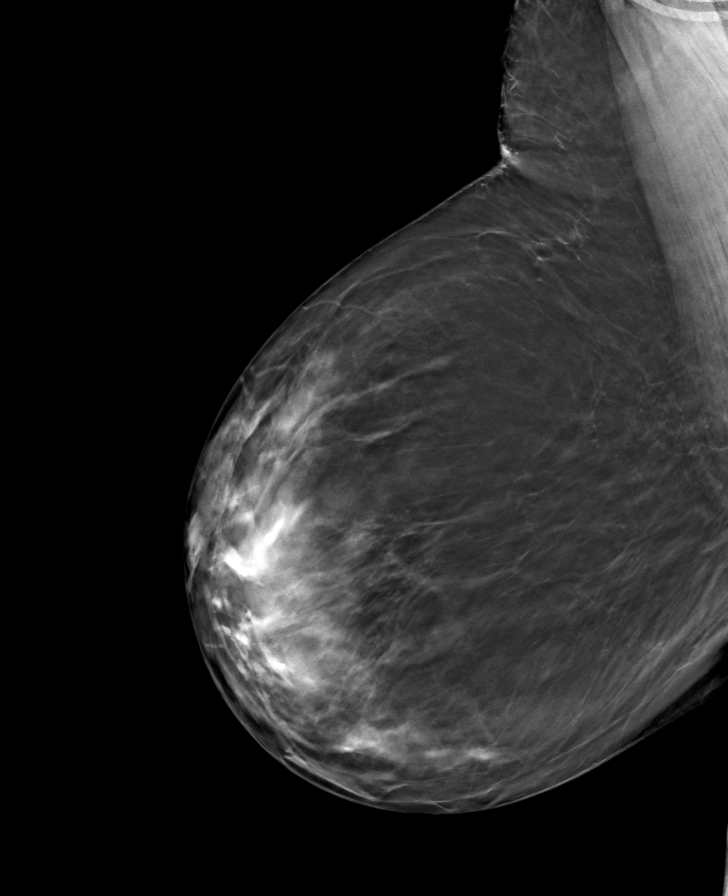

[R CC tomo · tomo slice 39/78.0]
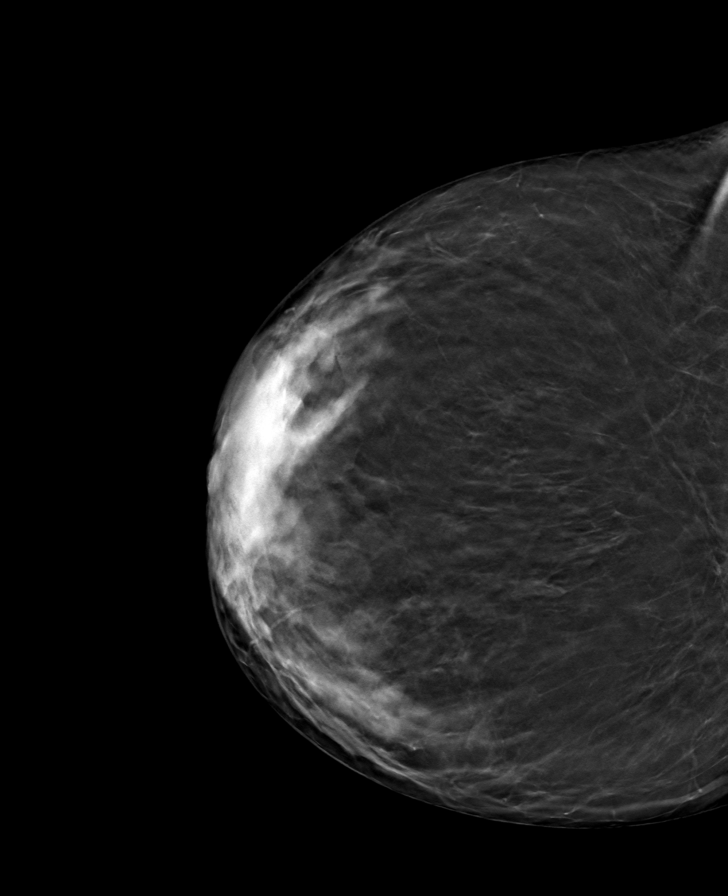

[L MLO tomo · tomo slice 47/93.0]
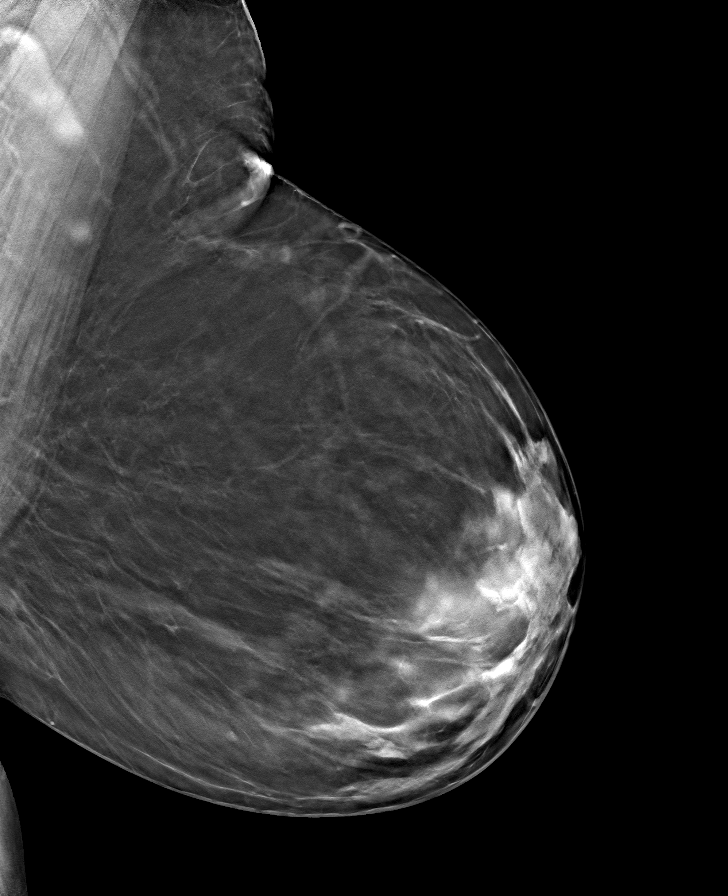

[L CC tomo · tomo slice 48/95.0]
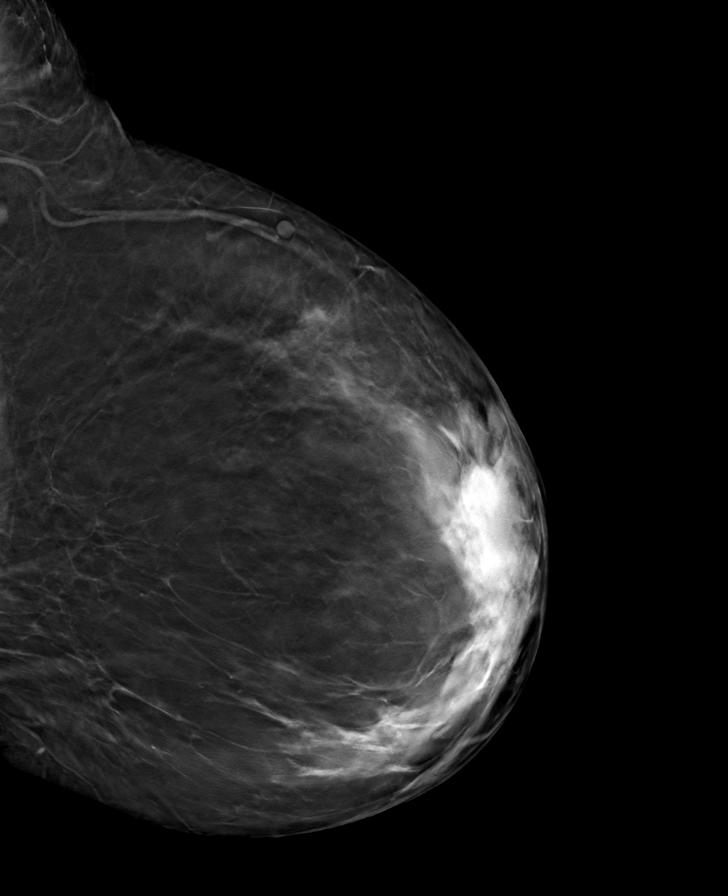

[8 of 24 positions shown; findings below may reference images not displayed]

ACR Breast Density Category c: The breast tissue is heterogeneously
dense, which may obscure small masses.
FINDINGS: There are no findings suspicious for malignancy.
IMPRESSION: No mammographic evidence of malignancy. A result letter of this
screening mammogram will be mailed directly to the patient.

RECOMMENDATION:
Screening mammogram in one year. (Code:Q3-W-BC3)

BI-RADS CATEGORY  1: Negative.

## 2023-02-04 LAB — NMR, LIPOPROFILE
Cholesterol, Total: 232 mg/dL — ABNORMAL HIGH (ref 100–199)
HDL Particle Number: 42.6 umol/L (ref >=30.5)
HDL-C: 86 mg/dL (ref >39)
LDL Particle Number: 1216 nmol/L — ABNORMAL HIGH (ref <1000)
LDL Size: 21.3 nmol (ref >20.5)
LDL-C (NIH Calc): 125 mg/dL — ABNORMAL HIGH (ref 0–99)
LP-IR Score: <25 (ref <=45)
Small LDL Particle Number: 216 nmol/L (ref <=527)
Triglycerides: 124 mg/dL (ref 0–149)

## 2023-02-04 LAB — COMPREHENSIVE METABOLIC PANEL
ALT: 29 [IU]/L (ref 0–32)
AST: 34 [IU]/L (ref 0–40)
Albumin: 4.3 g/dL (ref 3.8–4.8)
Alkaline Phosphatase: 57 [IU]/L (ref 44–121)
BUN/Creatinine Ratio: 13 (ref 12–28)
BUN: 12 mg/dL (ref 8–27)
Bilirubin Total: 0.4 mg/dL (ref 0.0–1.2)
CO2: 22 mmol/L (ref 20–29)
Calcium: 9.3 mg/dL (ref 8.7–10.3)
Chloride: 104 mmol/L (ref 96–106)
Creatinine, Ser: 0.96 mg/dL (ref 0.57–1.00)
Globulin, Total: 2.3 g/dL (ref 1.5–4.5)
Glucose: 101 mg/dL — ABNORMAL HIGH (ref 70–99)
Potassium: 4.3 mmol/L (ref 3.5–5.2)
Sodium: 141 mmol/L (ref 134–144)
Total Protein: 6.6 g/dL (ref 6.0–8.5)
eGFR: 62 mL/min/{1.73_m2} (ref >59)

## 2023-02-04 LAB — LIPOPROTEIN A (LPA): Lipoprotein (a): <8.4 nmol/L (ref <75.0)

## 2023-02-04 LAB — APOLIPOPROTEIN B: Apolipoprotein B: 92 mg/dL — ABNORMAL HIGH (ref <90)

## 2023-02-05 NOTE — Progress Notes (Unsigned)
Cardiology Office Note:  .   Date:  02/10/2023 ID:  Jules Husbands, DOB 1945-08-08, MRN [id number] PCP: Merri Brunette, MD [facility] Providers Cardiologist:  Dr. Chilton Si    Patient Profile: .      PMH CAD Elevated coronary calcium score of 545 (87th percentile) on CT calcium score 10/13/22 Dyslipidemia GERD Obesity Heart murmur (known) Intolerant to rosuvastatin  Seen by me on 11/11/2022 for new patient consult for elevated coronary calcium score on CT 10/13/22.  CT reveals CAC score of 545 (87th percentile), with the majority of the calcium located in LAD and LM.  We discussed these findings in detail. She reports she is active daily with walking, attending [facility]  She denies any specific cardiac symptoms.  No palpitations, chest pain, shortness of breath, dyspnea, orthopnea, PND, presyncope, syncope. Family history is not significant for CAD. Overall, she thinks her diet is healthy but admits she does love pork. ASCVD risk score is 12.8%.     History of Present Illness: .   SHIRENE FEBO is a very pleasant 77 y.o. female  who is here today for follow-up. She did not tolerate rosuvastatin 10 mg. Reports that the medication made her feel 'sick,' with joint aches, lethargy, and excessive sleepiness. As a result, she discontinued the medication after two weeks. History of hiatal hernia, which causes difficulty with food tolerance. She has stopped all medications prescribed by her gastroenterologist due to side effects. We reviewed lipid results in detail including elevated apolipoprotein B at 92, LDL particle number 1216, total cholesterol 232, HDL 86, small LDL particle #216, LDL 125, and normal Lp(a). She reports a healthy lifestyle, with daily walking and gym visits five days a week. She has a limited diet due to her hiatal hernia, but tries to consume salads, vegetables, and small amounts of [place] sausage or bacon.  She denies chest pain,  palpitations, shortness of breath, orthopnea, PND, and edema.  No presyncope or syncope.  ROS: See HPI       Studies Reviewed: Marland Kitchen     EKG Interpretation Date/Time:  Wednesday February 10 2023 11:10:19 EST Ventricular Rate:  46 PR Interval:  140 QRS Duration:  80 QT Interval:  458 QTC Calculation: 400 R Axis:   50  Text Interpretation: Sinus bradycardia No previous ECGs available Artifact No acute ST abnormality Confirmed by Eligha Bridegroom [phone]) on 02/10/2023 11:22:04 AM       Risk Assessment/Calculations:         Physical Exam:    BP 124/68   Pulse (!) 46   Ht 5\' 8"  (1.727 m)   Wt 185 lb 3.2 oz (84 kg)   SpO2 98%   BMI 28.16 kg/m     GEN: Well nourished, well developed in no acute distress NECK: No JVD; No carotid bruits CARDIAC: RRR, slight systolic murmur. No rubs, gallops RESPIRATORY:  Clear to auscultation without rales, wheezing or rhonchi  ABDOMEN: Soft, non-tender, non-distended EXTREMITIES:  No edema; No deformity     ASSESSMENT AND PLAN: .    Coronary artery calcification on CT: CAC score 545 (87th percentile). She remains active and denies symptoms concerning for angina. EKG reveals sinus bradycardia at 46 bpm, no ST abnormality. Not on asa due to GI sensitivity.  She did not tolerate rosuvastatin.  We will try pravastatin 40 mg daily.  Dyslipidemia LDL goal < 70: LDL 140 on 1/91/47 with slight improvement to 125 on 02/03/24, elevated LDL particle  number, elevated ApoB. She did not tolerate rosuvastatin 10 mg daily due to side effects of lethargy, joint aches, and excessive sleepiness. We discussed potential treatment options.  She will try pravastatin 40 mg daily.  States if she does not tolerate, she does not want to try any other statins.  Consider PCSK9 inhibitor or bempedoic acid if intolerant.  Murmur: Soft systolic murmur noted on exam. She is asymptomatic. No prior echocardiogram. She is asymptomatic. Advised her to consider echocardiogram to  evaluate valve function.  She politely declines at this time but will let us know if she reconsiders.    Activity/Diet goals: Continue regular exercise for goal of 150 minutes moderate intensity exercise each week. Continue heart healthy diet.       Dispo: 6 months with me  Signed, Eligha Bridegroom, NP-C

## 2023-02-10 ENCOUNTER — Encounter (HOSPITAL_BASED_OUTPATIENT_CLINIC_OR_DEPARTMENT_OTHER): Payer: Self-pay | Admitting: Nurse Practitioner

## 2023-02-10 ENCOUNTER — Ambulatory Visit (HOSPITAL_BASED_OUTPATIENT_CLINIC_OR_DEPARTMENT_OTHER): Payer: Medicare HMO | Admitting: Nurse Practitioner

## 2023-02-10 VITALS — BP 124/68 | HR 46 | Ht 68.0 in | Wt 185.2 lb

## 2023-02-10 DIAGNOSIS — E785 Hyperlipidemia, unspecified: Secondary | ICD-10-CM | POA: Diagnosis not present

## 2023-02-10 DIAGNOSIS — R011 Cardiac murmur, unspecified: Secondary | ICD-10-CM | POA: Diagnosis not present

## 2023-02-10 DIAGNOSIS — I2510 Atherosclerotic heart disease of native coronary artery without angina pectoris: Secondary | ICD-10-CM | POA: Diagnosis not present

## 2023-02-10 DIAGNOSIS — Z789 Other specified health status: Secondary | ICD-10-CM | POA: Diagnosis not present

## 2023-02-10 MED ORDER — PRAVASTATIN SODIUM 40 MG PO TABS: 40.0000 mg | ORAL_TABLET | Freq: Every evening | ORAL | 3 refills | Status: DC

## 2023-02-10 NOTE — Patient Instructions (Signed)
Medication Instructions:   START Pravastatin one (1) tablet by mouth ( 40 mg) daily. Let us know if you do not tolerate, send by [employer].   *If you need a refill on your cardiac medications before your next appointment, please call your pharmacy*   Lab Work:  None ordered.  If you have labs (blood work) drawn today and your tests are completely normal, you will receive your results only by: MyChart Message (if you have MyChart) OR A paper copy in the mail If you have any lab test that is abnormal or we need to change your treatment, we will call you to review the results.   Testing/Procedures:  None ordered.   Follow-Up: At [facility], you and your health needs are our priority.  As part of our continuing mission to provide you with exceptional heart care, we have created designated Provider Care Teams.  These Care Teams include your primary Cardiologist (physician) and Advanced Practice Providers (APPs -  Physician Assistants and Nurse Practitioners) who all work together to provide you with the care you need, when you need it.  We recommend signing up for the patient portal called "MyChart".  Sign up information is provided on this After Visit Summary.  MyChart is used to connect with patients for Virtual Visits (Telemedicine).  Patients are able to view lab/test results, encounter notes, upcoming appointments, etc.  Non-urgent messages can be sent to your provider as well.   To learn more about what you can do with MyChart, go to [email or web address].    Your next appointment:   6 month(s)  Provider:   Eligha Bridegroom, NP         Other Instructions  Your physician wants you to follow-up in: 6 months.  You will receive a reminder letter in the mail two months in advance. If you don't receive a letter, please call our office to schedule the follow-up appointment.

## 2023-02-10 NOTE — Addendum Note (Signed)
Addended by: Levi Aland on: 02/10/2023 12:34 PM   Modules accepted: Orders

## 2023-04-02 ENCOUNTER — Encounter: Payer: Self-pay | Admitting: Gastroenterology

## 2023-04-02 ENCOUNTER — Ambulatory Visit (INDEPENDENT_AMBULATORY_CARE_PROVIDER_SITE_OTHER): Payer: Medicare HMO | Admitting: Gastroenterology

## 2023-04-02 VITALS — BP 120/62 | HR 68 | Ht 68.0 in | Wt 185.0 lb

## 2023-04-02 DIAGNOSIS — K449 Diaphragmatic hernia without obstruction or gangrene: Secondary | ICD-10-CM | POA: Diagnosis not present

## 2023-04-02 DIAGNOSIS — K219 Gastro-esophageal reflux disease without esophagitis: Secondary | ICD-10-CM | POA: Diagnosis not present

## 2023-04-02 DIAGNOSIS — Z860100 Personal history of colon polyps, unspecified: Secondary | ICD-10-CM | POA: Diagnosis not present

## 2023-04-02 NOTE — Progress Notes (Addendum)
 Chief Complaint: GERD   Referring Provider:     Elsie Cree, MD    HPI:     Casey Woods is a 78 y.o. female with a history of CAD, dyslipidemia, referred to me by Dr. Cree for evaluation of possible antireflux intervention with Transoral Incisionless Fundoplication (TIF) with a goal to stop or significantly reduce acid suppression therapy.  She has had reflux symptoms for the last 3 years or so, mainly regurgitation, belching.  Rare heartburn.  Symptoms tend to be postprandial.  Has trialed multiple acid suppression medications which have ultimately been efficacious, but has been complicated by various allergies and intolerances, namely rash to each of the different PPIs that she has tried.  Used Voquenza which worked, but was cost prohibitive.  Rash tends to occur within 2 weeks or so of starting PPI, then resolves after stopping PPI.  Most recently stopped all PPIs for about a month, but with ongoing reflux symptoms requiring regular use of Tums, so she restarted omeprazole 20 mg daily, again with good efficacy, but rash has started again on her upper extremities.  GERD history: -Index symptoms: Heartburn, regurgitation, belching.  No dysphagia -Exacerbating features: Acidic juices, soda -Medications trialed: Esomeprazole (rash), pantoprazole (rash), famotidine  (hair loss), Dexlansoprazole (eye twitching, rash), Voquenza (cost prohibitive), Carafate (nausea/vomiting), Tums -Current medications: Omeprazole 20 mg daily -Complications: Hiatal hernia  GERD evaluation: -Last EGD: 04/2018 -Barium esophagram: 01/2022: Small to medium hiatal hernia with nonobstructing distal esophageal ring, normal motility without mass or stricture.  Moderate reflux to the level of the aortic arch with water  siphon test, spontaneous reflux noted as well.  13 mm tablet passed without delay -Esophageal Manometry: None -pH/Impedance: None -Bravo: None -GES: 09/2022: Normal  Endoscopic  History: - 08/31/2013: Colonoscopy: Hyperplastic polyp, sigmoid diverticulosis, otherwise normal.  Scheduled for repeat in 08/2023 with Dr. Cree - 05/19/2018: EGD: Small hiatal hernia, Schatzki's ring, dilated.  Normal stomach and duodenum   Over 40 pages of referral documents reviewed for this appointment and notable for the following:  - 08/31/2013: Colonoscopy: Hyperplastic polyp, sigmoid diverticulosis, otherwise normal.  Scheduled for repeat in 08/2023 with Dr. Cree - 05/19/2018: EGD: Small hiatal hernia, Schatzki's ring, dilated.  Normal stomach and duodenum - 02/06/2022: Barium swallow: Small to medium hiatal hernia with nonobstructing distal esophageal ring, normal motility without mass or stricture.  Moderate reflux to the level of the aortic arch with water  siphon test, spontaneous reflux noted as well.  13 mm tablet passed without delay - 10/02/2022: GES: Normal - 01/10/2023: Follow-up appoint with Dr. Cree.  Continued reflux symptoms which have been intolerant or lack of response to multiple acid suppression medications (Nexium, pantoprazole, famotidine ), including most recently Voquenza and Dexilant (eye twitching, rash).  Continued regurgitation despite aspiration therapy, lifestyle/dietary modification.  No dysphagia.  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   Elevated cholesterol    GERD (gastroesophageal reflux disease)    Venous insufficiency (chronic) (peripheral)      Past Surgical History:  Procedure Laterality Date   ABDOMINAL HYSTERECTOMY     BUNIONECTOMY Bilateral    LASER ABLATION     right - varicose veins   LEG SURGERY     Family History  Problem Relation Age  of Onset   Other Mother        PAD w/ hx left BKA    Heart attack Father        died age 51   Heart disease  Brother        died age 54- CHF   Liver disease Neg Hx    Colon cancer Neg Hx    Esophageal cancer Neg Hx    Social History   Tobacco Use   Smoking status: Never   Smokeless tobacco: Never  Vaping Use   Vaping status: Never Used  Substance Use Topics   Alcohol use: Not Currently    Comment: SOCIAL   Drug use: No   Current Outpatient Medications  Medication Sig Dispense Refill   CALCIUM  PO Take by mouth.     Cholecalciferol (VITAMIN D3) 1000 units CAPS 1 capsule Orally Once a day     clotrimazole-betamethasone (LOTRISONE) lotion Apply topically 2 (two) times daily.     Multiple Vitamins-Minerals (MULTIVITAMIN WITH MINERALS) tablet Take 1 tablet by mouth daily.     nystatin -triamcinolone  ointment (MYCOLOG) Apply 1 Application topically daily. 30 g 5   pravastatin  (PRAVACHOL ) 40 MG tablet Take 1 tablet (40 mg total) by mouth every evening. 30 tablet 3   TRIAMCINOLONE  ACETONIDE EX Apply topically. 0.1%     VITAMIN D PO Take 1,000 Int'l Units by mouth.     acetaminophen  (TYLENOL ) 500 MG tablet Take 500 mg by mouth every 6 (six) hours as needed. (Patient not taking: Reported on 04/02/2023)     mometasone (ELOCON) 0.1 % cream Apply 1 application. topically 2 (two) times daily.     No current facility-administered medications for this visit.   Allergies  Allergen Reactions   Rosuvastatin  Calcium  Other (See Comments)    Lethargy, joint pain, aching   Beef-Derived Drug Products     headache   Dexlansoprazole Rash   Esomeprazole Rash   Voquezna Dual Pak [Amoxicillin & Vonoprazan] Rash     Review of Systems: All systems reviewed and negative except where noted in HPI.     Physical Exam:    Wt Readings from Last 3 Encounters:  04/02/23 185 lb (83.9 kg)  02/10/23 185 lb 3.2 oz (84 kg)  11/11/22 191 lb 1.6 oz (86.7 kg)    BP 120/62   Pulse 68   Ht 5' 8 (1.727 m)   Wt 185 lb (83.9 kg)   BMI 28.13 kg/m  Constitutional:  Pleasant, in no acute distress. Neurological:  Alert and oriented to person place and time. Skin: Skin is warm and dry. No rashes noted.   ASSESSMENT AND PLAN;   1) GERD 2) Hiatal hernia Discussed pathophysiology of reflux at length today.  She has clear objective evidence of reflux on her prior esophagram.  No additional pH/impedance or Bravo testing needed at this juncture.  Thankfully, good clinical response to acid suppression medications, but unfortunately has had issues tolerating each of the PPIs (rash), famotidine  (hair loss), and Voquenza was cost prohibitive.  We discussed the role/utility of antireflux surgery at length today, specifically TIF.  While her prior EGD reports a small hiatal hernia, based on the esophagram, I suspect she has a good sized hernia with advanced Hill grade valve and LES laxity, all contributing to her continued reflux, and therefore also discussed concomitant laparoscopic hiatal hernia repair.  She is interested in antireflux surgical options and we will proceed as follows:  - Continue omeprazole 20 mg daily - Continue antireflux lifestyle/diet  modifications - EGD for preoperative assessment and to evaluate for erosive esophagitis, degree of LES laxity, and size/grade of hiatal hernia - Depending on endoscopic findings, likely plan for surgical referral to discuss concomitant laparoscopic hiatal hernia repair and TIF - If wanting to continue with medical therapy alone and if unable to tolerate continued omeprazole, may trial rabeprazole  3) History of colon polyps - Scheduled for colonoscopy with Dr. Burnette later this year  The indications, risks, and benefits of EGD were explained to the patient in detail. Risks include but are not limited to bleeding, perforation, adverse reaction to medications, and cardiopulmonary compromise. Sequelae include but are not limited to the possibility of surgery, hospitalization, and mortality. The patient verbalized understanding and wished to proceed. All questions  answered, referred to scheduler. Further recommendations pending results of the exam.    I spent 45 minutes of time, including in depth chart review, review of prior notes from referring provider, independent review of results as outlined above, communicating results with the patient directly, face-to-face time with the patient, coordinating care, ordering studies and medications as appropriate, and documentation.    Sandor LULLA Flatter, DO, FACG  04/02/2023, 8:47 AM   Burnette Fallow, MD

## 2023-04-02 NOTE — Patient Instructions (Signed)
 You have been scheduled for an endoscopy. Please follow written instructions given to you at your visit today.  If you use inhalers (even only as needed), please bring them with you on the day of your procedure.  If you take any of the following medications, they will need to be adjusted prior to your procedure:   DO NOT TAKE 7 DAYS PRIOR TO TEST- Trulicity (dulaglutide) Ozempic, Wegovy (semaglutide) Mounjaro (tirzepatide) Bydureon Bcise (exanatide extended release)  DO NOT TAKE 1 DAY PRIOR TO YOUR TEST Rybelsus (semaglutide) Adlyxin (lixisenatide) Victoza (liraglutide) Byetta (exanatide) ___________________________________________________________________________    _______________________________________________________  If your blood pressure at your visit was 140/90 or greater, please contact your primary care physician to follow up on this.  _______________________________________________________  If you are age 76 or older, your body mass index should be between 23-30. Your Body mass index is 28.13 kg/m. If this is out of the aforementioned range listed, please consider follow up with your Primary Care Provider.  If you are age 24 or younger, your body mass index should be between 19-25. Your Body mass index is 28.13 kg/m. If this is out of the aformentioned range listed, please consider follow up with your Primary Care Provider.   ________________________________________________________  The [facility] providers would like to encourage you to use MYCHART to communicate with providers for non-urgent requests or questions.  Due to long hold times on the telephone, sending your provider a message by [facility] may be a faster and more efficient way to get a response.  Please allow 48 business hours for a response.  Please remember that this is for non-urgent requests.  _______________________________________________________  Due to recent changes in healthcare laws, you may see  the results of your imaging and laboratory studies on MyChart before your provider has had a chance to review them.  We understand that in some cases there may be results that are confusing or concerning to you. Not all laboratory results come back in the same time frame and the provider may be waiting for multiple results in order to interpret others.  Please give us  48 hours in order for your provider to thoroughly review all the results before contacting the office for clarification of your results.   Thank you for choosing me and [facility].  Dr.Vito Cirigliano

## 2023-04-04 ENCOUNTER — Encounter: Payer: Self-pay | Admitting: Certified Registered Nurse Anesthetist

## 2023-04-05 ENCOUNTER — Encounter: Payer: Self-pay | Admitting: Gastroenterology

## 2023-04-12 ENCOUNTER — Ambulatory Visit: Payer: Medicare HMO | Admitting: Gastroenterology

## 2023-04-12 ENCOUNTER — Telehealth: Payer: Self-pay | Admitting: *Deleted

## 2023-04-12 ENCOUNTER — Encounter: Payer: Self-pay | Admitting: Gastroenterology

## 2023-04-12 VITALS — BP 119/63 | HR 42 | Temp 97.3°F | Resp 17 | Ht 68.0 in | Wt 185.0 lb

## 2023-04-12 DIAGNOSIS — Z01818 Encounter for other preprocedural examination: Secondary | ICD-10-CM

## 2023-04-12 DIAGNOSIS — K219 Gastro-esophageal reflux disease without esophagitis: Secondary | ICD-10-CM

## 2023-04-12 DIAGNOSIS — K449 Diaphragmatic hernia without obstruction or gangrene: Secondary | ICD-10-CM | POA: Diagnosis not present

## 2023-04-12 MED ORDER — SODIUM CHLORIDE 0.9 % IV SOLN: 500.0000 mL | Freq: Once | INTRAVENOUS | Status: DC

## 2023-04-12 NOTE — Telephone Encounter (Signed)
20 pages of records/referral placed to [facility].

## 2023-04-12 NOTE — Telephone Encounter (Signed)
-----   Message from Shellia Cleverly sent at 04/12/2023 12:04 PM EST ----- Can you please put a referral for this patient to Dr. Doylene Canard or Dr. Andrey Campanile at [facility] for consideration of concomitant laparoscopic hiatal hernia repair and TIF.  Thanks.

## 2023-04-12 NOTE — Progress Notes (Signed)
1029 Robinul 0.1 mg IV given due large amount of secretions upon assessment.  MD made aware, vss 

## 2023-04-12 NOTE — Op Note (Signed)
[facility] Patient Name: Casey Woods Procedure Date: 04/12/2023 10:28 AM MRN: [id number] Endoscopist: Doristine Locks , MD, [id number] Age: 78 Referring MD:  Date of Birth: 10/12/1945 Gender: Female Account #: [id number] Procedure:                Upper GI endoscopy Indications:              Heartburn, Esophageal reflux, Preoperative                            assessment for anti-reflux surgery, Regurgitation Medicines:                Monitored Anesthesia Care Procedure:                Pre-Anesthesia Assessment:                           - Prior to the procedure, a History and Physical                            was performed, and patient medications and                            allergies were reviewed. The patient's tolerance of                            previous anesthesia was also reviewed. The risks                            and benefits of the procedure and the sedation                            options and risks were discussed with the patient.                            All questions were answered, and informed consent                            was obtained. Prior Anticoagulants: The patient has                            taken no anticoagulant or antiplatelet agents. ASA                            Grade Assessment: II - A patient with mild systemic                            disease. After reviewing the risks and benefits,                            the patient was deemed in satisfactory condition to                            undergo the procedure.  After obtaining informed consent, the endoscope was                            passed under direct vision. Throughout the                            procedure, the patient's blood pressure, pulse, and                            oxygen saturations were monitored continuously. The                            GIF [id number] #[id number] was introduced through the                            mouth, and  advanced to the second part of duodenum.                            The upper GI endoscopy was accomplished without                            difficulty. The patient tolerated the procedure                            well. Scope In: Scope Out: Findings:                 The examined esophagus was normal.                           The Z-line was regular and was found 38 cm from the                            incisors.                           A 2 cm sliding type hiatal hernia was present.                           The gastroesophageal flap valve was visualized                            endoscopically and classified as Hill Grade III                            (minimal fold, loose to endoscope, hiatal hernia                            likely).                           The entire examined stomach was normal.                           The examined duodenum was normal. Complications:  No immediate complications. Estimated Blood Loss:     Estimated blood loss: none. Impression:               - Normal esophagus.                           - Z-line regular, 38 cm from the incisors.                           - 2 cm sliding type hiatal hernia.                           - Gastroesophageal flap valve classified as Hill                            Grade III (minimal fold, loose to endoscope, hiatal                            hernia likely).                           - Normal stomach.                           - Normal examined duodenum.                           - No specimens collected. Recommendation:           - Patient has a contact number available for                            emergencies. The signs and symptoms of potential                            delayed complications were discussed with the                            patient. Return to normal activities tomorrow.                            Written discharge instructions were provided to the                             patient.                           - Resume previous diet.                           - Continue present medications.                           - Based on the size of the hiatal hernia and                            advanced Hill grade valve/degree of LES laxity, if  planning antireflux surgery, I recommend                            concomitant laparoscopic hiatal hernia                            repair/crural repair with Transoral Incisionless                            Fundoplication (cTIF). Doristine Locks, MD 04/12/2023 10:46:33 AM

## 2023-04-12 NOTE — Progress Notes (Signed)
Report given to PACU, vss 

## 2023-04-12 NOTE — Progress Notes (Signed)
Pt's states no medical or surgical changes since previsit or office visit. 

## 2023-04-12 NOTE — Patient Instructions (Signed)

## 2023-04-12 NOTE — Progress Notes (Signed)
GASTROENTEROLOGY PROCEDURE H&P NOTE   Primary Care Physician: Merri Brunette, MD    Reason for Procedure:  GERD, preoperative evaluation, regurgitation, belching, hiatal hernia  Plan:    EGD  Patient is appropriate for endoscopic procedure(s) in the ambulatory ([facility]) setting.  The nature of the procedure, as well as the risks, benefits, and alternatives were carefully and thoroughly reviewed with the patient. Ample time for discussion and questions allowed. The patient understood, was satisfied, and agreed to proceed.     HPI: Casey Woods is a 78 y.o. female who presents for EGD for evaluation of GERD, hiatal hernia, preoperative evaluation for potential antireflux surgery.  Patient was most recently seen in the Gastroenterology Clinic on 04/02/2023 by me.  No interval change in medical history since that appointment. Please refer to that note for full details regarding GI history and clinical presentation.   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 Authorizing Provider  CALCIUM PO Take by mouth.   Yes [provider]  Cholecalciferol (VITAMIN D3) 1000 units CAPS 1 capsule Orally Once a day   Yes [provider]  Multiple Vitamins-Minerals (MULTIVITAMIN WITH MINERALS) tablet Take 1 tablet by mouth daily.   Yes [provider]  pravastatin (PRAVACHOL) 40 MG tablet Take 1 tablet (40 mg total) by mouth every evening. 02/10/23 05/11/23 Yes Swinyer, Zachary George, NP  VITAMIN D PO Take 1,000 Int'l Units by mouth.   Yes [provider]  acetaminophen (TYLENOL) 500 MG tablet Take 500 mg by mouth every 6 (six) hours as needed. Patient not taking: Reported on 04/12/2023    [provider]  calcium carbonate (TUMS) 500 MG chewable tablet Chew 1 tablet by mouth 2 (two) times daily. As needed per pt    [provider]  clotrimazole-betamethasone (LOTRISONE) lotion Apply topically 2 (two) times daily.    [provider]  mometasone (ELOCON) 0.1 % cream Apply 1 application. topically 2 (two) times daily. 08/01/21   [provider]  nystatin-triamcinolone ointment (MYCOLOG) Apply 1 Application topically daily. 09/02/22   Genia Del, MD  TRIAMCINOLONE ACETONIDE EX Apply topically. 0.1%    [provider]    Current Outpatient Medications  Medication Sig Dispense Refill   CALCIUM PO Take by mouth.     Cholecalciferol (VITAMIN D3) 1000 units CAPS 1 capsule Orally Once a day     Multiple Vitamins-Minerals (MULTIVITAMIN WITH MINERALS) tablet Take 1 tablet by mouth daily.     pravastatin (PRAVACHOL) 40 MG tablet Take 1 tablet (40 mg total) by mouth every evening. 30 tablet 3   VITAMIN D PO Take 1,000 Int'l Units by mouth.     acetaminophen (TYLENOL) 500 MG tablet Take 500 mg by mouth every 6 (six) hours as needed. (Patient not taking: Reported on 04/12/2023)     calcium carbonate (TUMS) 500 MG chewable tablet Chew 1 tablet by mouth 2 (two) times daily. As needed per pt     clotrimazole-betamethasone (LOTRISONE) lotion  Apply topically 2 (two) times daily.     mometasone (ELOCON) 0.1 % cream Apply 1 application. topically 2 (two) times daily.     nystatin-triamcinolone ointment (MYCOLOG) Apply 1 Application topically daily. 30 g 5   TRIAMCINOLONE ACETONIDE EX Apply topically. 0.1%     Current Facility-Administered Medications  Medication Dose Route Frequency Provider Last Rate Last Admin   0.9 %  sodium chloride infusion  500 mL Intravenous Once Granvil Djordjevic V, DO        Allergies  as of 04/12/2023 - Review Complete 04/12/2023  Allergen Reaction Noted   Beef-derived drug products  01/17/2011   Rosuvastatin calcium Other (See Comments) 02/10/2023   Dexlansoprazole Rash 09/02/2022   Esomeprazole Rash 09/02/2022   Voquezna dual pak [amoxicillin & vonoprazan] Rash 09/02/2022    Family History  Problem Relation Age of Onset   Other Mother        PAD w/ hx left BKA    Heart attack Father        died age 58   Heart disease Brother        died age 16- CHF   Liver disease Neg Hx    Colon cancer Neg Hx    Esophageal cancer Neg Hx     Social History   Socioeconomic History   Marital status: Divorced    Spouse name: Not on file   Number of children: 1   Years of education: Not on file   Highest education level: Not on file  Occupational History   Occupation: retired  Tobacco Use   Smoking status: Never   Smokeless tobacco: Never  Vaping Use   Vaping status: Never Used  Substance and Sexual Activity   Alcohol use: Not Currently    Comment: SOCIAL   Drug use: No   Sexual activity: Yes    Partners: Male    Birth control/protection: Surgical    Comment: 1ST INTERCOURSE- 19, PARTNERS- 3,  hysterectomy  Other Topics Concern   Not on file  Social History Narrative   Not on file   Social Drivers of Health   Financial Resource Strain: Not on file  Food Insecurity: No Food Insecurity (11/11/2022)   Hunger Vital Sign    Worried About Running Out of Food in the Last Year: Never true    Ran Out of Food in the Last Year: Never true  Transportation Needs: No Transportation Needs (11/11/2022)   PRAPARE - Administrator, Civil Service (Medical): No    Lack of Transportation (Non-Medical): No  Physical Activity: Sufficiently Active (11/11/2022)   Exercise Vital Sign    Days of Exercise per Week: 5 days    Minutes of Exercise per Session: 40 min  Stress: Not on file  Social Connections: Not on file  Intimate Partner Violence: Not on file     Physical Exam: Vital signs in last 24 hours: @BP  (!) 131/59   Pulse (!) 51   Temp (!) 97.3 F (36.3 C) (Temporal)   Ht 5\' 8"  (1.727 m)   Wt 185 lb (83.9 kg)   SpO2 98%   BMI 28.13 kg/m  GEN: NAD EYE: Sclerae anicteric ENT: MMM CV: Non-tachycardic Pulm: CTA b/l GI: Soft, NT/ND NEURO:  Alert & Oriented x 3   Doristine Locks, DO [facility]   04/12/2023 10:23 AM

## 2023-04-14 ENCOUNTER — Telehealth: Payer: Self-pay

## 2023-04-14 NOTE — Telephone Encounter (Signed)
LMOM

## 2023-04-14 NOTE — Telephone Encounter (Signed)
Patient is scheduled with [facility] Surgery on 05/07/23.

## 2023-05-08 ENCOUNTER — Other Ambulatory Visit (HOSPITAL_BASED_OUTPATIENT_CLINIC_OR_DEPARTMENT_OTHER): Payer: Self-pay | Admitting: Nurse Practitioner

## 2023-05-21 ENCOUNTER — Ambulatory Visit: Payer: Medicare HMO | Admitting: Obstetrics and Gynecology

## 2023-05-21 VITALS — BP 124/72

## 2023-05-21 DIAGNOSIS — Z1231 Encounter for screening mammogram for malignant neoplasm of breast: Secondary | ICD-10-CM

## 2023-05-21 DIAGNOSIS — R21 Rash and other nonspecific skin eruption: Secondary | ICD-10-CM

## 2023-05-21 DIAGNOSIS — Z1211 Encounter for screening for malignant neoplasm of colon: Secondary | ICD-10-CM

## 2023-05-21 DIAGNOSIS — E2839 Other primary ovarian failure: Secondary | ICD-10-CM

## 2023-05-21 DIAGNOSIS — N952 Postmenopausal atrophic vaginitis: Secondary | ICD-10-CM | POA: Diagnosis not present

## 2023-05-21 LAB — WET PREP FOR TRICH, YEAST, CLUE

## 2023-05-21 MED ORDER — NYSTATIN 100000 UNIT/GM EX POWD: 1.0000 | Freq: Three times a day (TID) | CUTANEOUS | 3 refills | Status: AC

## 2023-05-21 MED ORDER — INTRAROSA 6.5 MG VA INST: 1.0000 | VAGINAL_INSERT | Freq: Every evening | VAGINAL | 12 refills | Status: DC | PRN

## 2023-05-21 NOTE — Patient Instructions (Addendum)
 2018 ultrasound with no uterus, so I did not place an ultrasound order. I placed a referral for a bone scan and mammogram.  It was so nice meeting you today. Andreas Newport was sent to [employer]script.  They should call you in 24 hours.  They will mail it to you, if you approve it. I can send over nystatin powder as well to try on the rash. Dr. Karma Greaser

## 2023-05-21 NOTE — Progress Notes (Signed)
 78 y.o. y.o. female here for rash under breast and between thighs. From PPI x 2 years for hiatal hernia.  She is going to have surgery soon to repair this. She would not be a candidate for any fosamax treatment in the future, if needed.  No LMP recorded. Patient has had a hysterectomy.   G0 retired.  Adopted daughter coming back from [place] to live in [place].    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  Last Pap ASCUS/HPV HR Neg in 07/2021.  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  HPV 16-18-45 were negative.  Pap/HPV HR today.  No pelvic pain.  Continued vulvar itching, helped by the Mycolog ointment, prescribed.  Urine/BMs wnl.  Breasts wnl except for rash between and under the breasts for which she uses Mycolog as well.  Mammo Neg 11/2021 referral placed today.  BMI 28.56.  [facility] Bone Density with Dr Renne Crigler.  Colono 2020.  Referral for dxa placed today.  There is no height or weight on file to calculate BMI.      No data to display          Blood pressure 124/72.     Component Value Date/Time   DIAGPAP - Low grade squamous intraepithelial lesion (LSIL) (A) 09/02/2022 1150   DIAGPAP (A) 08/19/2021 1358    - Atypical squamous cells of undetermined significance (ASC-US)   DIAGPAP (A) 03/11/2021 1038    - Atypical squamous cells of undetermined significance (ASC-US)   HPVHIGH Negative 09/02/2022 1150   HPVHIGH Negative 08/19/2021 1358   HPVHIGH Negative 03/11/2021 1038   ADEQPAP Satisfactory for evaluation. 09/02/2022 1150   ADEQPAP Satisfactory for evaluation. 08/19/2021 1358   ADEQPAP Satisfactory for evaluation. 03/11/2021 1038    GYN HISTORY:    Component Value Date/Time   DIAGPAP - Low grade squamous intraepithelial lesion (LSIL) (A) 09/02/2022 1150   DIAGPAP (A) 08/19/2021 1358    - Atypical squamous cells of undetermined significance (ASC-US)   DIAGPAP (A) 03/11/2021 1038    - Atypical squamous  cells of undetermined significance (ASC-US)   HPVHIGH Negative 09/02/2022 1150   HPVHIGH Negative 08/19/2021 1358   HPVHIGH Negative 03/11/2021 1038   ADEQPAP Satisfactory for evaluation. 09/02/2022 1150   ADEQPAP Satisfactory for evaluation. 08/19/2021 1358   ADEQPAP Satisfactory for evaluation. 03/11/2021 1038    OB History  Gravida Para Term Preterm AB Living  0 0 0 0 0 0  SAB IAB Ectopic Multiple Live Births  0 0 0 0 0  Obstetric Comments  1 adopted daughter     Past Medical History:  Diagnosis Date   Coronary artery abnormality    Edema leg    left > right   Elevated cholesterol    GERD (gastroesophageal reflux disease)    Venous insufficiency (chronic) (peripheral)     Past Surgical History:  Procedure Laterality Date   ABDOMINAL HYSTERECTOMY     BUNIONECTOMY Bilateral    LASER ABLATION     right - varicose veins   LEG SURGERY      Current Outpatient Medications on File Prior to Visit  Medication Sig Dispense Refill   acetaminophen (TYLENOL) 500 MG tablet Take 500 mg by mouth every 6 (six) hours as needed.     calcium carbonate (TUMS) 500 MG chewable tablet Chew 1 tablet by mouth 2 (two) times daily. As needed per pt     CALCIUM PO Take by  mouth.     Cholecalciferol (VITAMIN D3) 1000 units CAPS 1 capsule Orally Once a day     clotrimazole-betamethasone (LOTRISONE) lotion Apply topically 2 (two) times daily.     mometasone (ELOCON) 0.1 % cream Apply 1 application. topically 2 (two) times daily.     Multiple Vitamins-Minerals (MULTIVITAMIN WITH MINERALS) tablet Take 1 tablet by mouth daily.     nystatin-triamcinolone ointment (MYCOLOG) Apply 1 Application topically daily. 30 g 5   Omeprazole Magnesium 20.6 (20 Base) MG CPDR      pravastatin (PRAVACHOL) 40 MG tablet TAKE 1 TABLET BY MOUTH EVERY DAY IN THE EVENING 90 tablet 1   TRIAMCINOLONE ACETONIDE EX Apply topically. 0.1%     VITAMIN D PO Take 1,000 Int'l Units by mouth.     No current facility-administered  medications on file prior to visit.    Social History   Socioeconomic History   Marital status: Divorced    Spouse name: Not on file   Number of children: 1   Years of education: Not on file   Highest education level: Not on file  Occupational History   Occupation: retired  Tobacco Use   Smoking status: Never   Smokeless tobacco: Never  Vaping Use   Vaping status: Never Used  Substance and Sexual Activity   Alcohol use: Not Currently    Comment: SOCIAL   Drug use: No   Sexual activity: Yes    Partners: Male    Birth control/protection: Surgical    Comment: 1ST INTERCOURSE- 19, PARTNERS- 3,  hysterectomy  Other Topics Concern   Not on file  Social History Narrative   Not on file   Social Drivers of Health   Financial Resource Strain: Not on file  Food Insecurity: No Food Insecurity (11/11/2022)   Hunger Vital Sign    Worried About Running Out of Food in the Last Year: Never true    Ran Out of Food in the Last Year: Never true  Transportation Needs: No Transportation Needs (11/11/2022)   PRAPARE - Administrator, Civil Service (Medical): No    Lack of Transportation (Non-Medical): No  Physical Activity: Sufficiently Active (11/11/2022)   Exercise Vital Sign    Days of Exercise per Week: 5 days    Minutes of Exercise per Session: 40 min  Stress: Not on file  Social Connections: Not on file  Intimate Partner Violence: Not on file    Family History  Problem Relation Age of Onset   Other Mother        PAD w/ hx left BKA    Heart attack Father        died age 46   Heart disease Brother        died age 22- CHF   Liver disease Neg Hx    Colon cancer Neg Hx    Esophageal cancer Neg Hx      Allergies  Allergen Reactions   Beef-Derived Drug Products     headache   Rosuvastatin Calcium Other (See Comments)    Lethargy, joint pain, aching   Dexlansoprazole Rash   Esomeprazole Rash   Voquezna Dual Pak [Amoxicillin & Vonoprazan] Rash       Patient's last menstrual period was No LMP recorded. Patient has had a hysterectomy..            Review of Systems Alls systems reviewed and are negative.     Physical Exam Constitutional:      Appearance: Normal appearance.  Genitourinary:  Genitourinary Comments: Shortened vagina atrophic     Severe vaginal atrophy present.    Cervix is absent.     Uterus is absent.  Abdominal:     Comments: Yeast like rash under breasts and in panus   Musculoskeletal:     Cervical back: Normal range of motion.  Neurological:     Mental Status: She is alert and oriented to person, place, and time.  Psychiatric:        Mood and Affect: Mood normal.        Behavior: Behavior normal.  Vitals and nursing note reviewed.    Vaginal culture: negative   A:         atrophic vaginitis, shortened vagina                             P:        culture negative today May use nystatin powder under breasts and thighs for rash Begin intrarosa vaginally for atrophic vaginitis Referral placed for dxa and mmg  No follow-ups on file.  Earley Favor

## 2023-05-21 NOTE — Addendum Note (Signed)
 Addended by: Windle Guard on: 05/21/2023 02:02 PM   Modules accepted: Orders

## 2023-05-24 ENCOUNTER — Encounter: Payer: Self-pay | Admitting: Obstetrics and Gynecology

## 2023-05-25 ENCOUNTER — Telehealth: Payer: Self-pay

## 2023-05-25 NOTE — Telephone Encounter (Signed)
 Patient aware the PA was still denied. I gave her the information to get the coupon online.

## 2023-05-25 NOTE — Telephone Encounter (Signed)
 Patient aware Prior authorization needed & it could take a few days for response. KEY: [id number]

## 2023-05-27 MED ORDER — ESTRADIOL 0.1 MG/GM VA CREA
1.0000 | TOPICAL_CREAM | Freq: Every day | VAGINAL | 12 refills | Status: DC
Start: 2023-05-27 — End: 2023-08-28

## 2023-05-27 NOTE — Addendum Note (Signed)
 Addended by: Earley Favor on: 05/27/2023 08:14 AM   Modules accepted: Orders

## 2023-05-31 ENCOUNTER — Telehealth: Payer: Self-pay | Admitting: *Deleted

## 2023-05-31 NOTE — Telephone Encounter (Signed)
 We have received notice from patient's insurance that cTIF with [employer] has been approved from 05/25/23-11/26/23. Authorization Number: [id number] Code: 81191 Facility: [facility]  See original approval under "media" section in EPIC.

## 2023-06-07 ENCOUNTER — Ambulatory Visit (HOSPITAL_BASED_OUTPATIENT_CLINIC_OR_DEPARTMENT_OTHER)
Admission: RE | Admit: 2023-06-07 | Discharge: 2023-06-07 | Disposition: A | Discharge status: Home / Self Care | Source: Ambulatory Visit | Attending: Obstetrics and Gynecology | Admitting: Obstetrics and Gynecology

## 2023-06-07 ENCOUNTER — Encounter: Payer: Self-pay | Admitting: Obstetrics and Gynecology

## 2023-06-07 ENCOUNTER — Encounter (HOSPITAL_BASED_OUTPATIENT_CLINIC_OR_DEPARTMENT_OTHER): Payer: Self-pay

## 2023-06-07 DIAGNOSIS — E2839 Other primary ovarian failure: Secondary | ICD-10-CM

## 2023-06-07 DIAGNOSIS — Z1231 Encounter for screening mammogram for malignant neoplasm of breast: Secondary | ICD-10-CM | POA: Insufficient documentation

## 2023-06-08 ENCOUNTER — Telehealth: Payer: Self-pay

## 2023-06-08 NOTE — Telephone Encounter (Signed)
 Lennox Laity with [employer] to coordinate cTIF. I left a vm for Morrie Sheldon to give me a call back to discuss Dr. Tawana Scale upcoming OR dates.

## 2023-06-09 ENCOUNTER — Encounter: Payer: Self-pay | Admitting: Obstetrics and Gynecology

## 2023-06-24 NOTE — Telephone Encounter (Addendum)
 Received a call from [place]. Morrie Sheldon will contact patient to tentatively plan for 08/27/23 starting case at 11 am.

## 2023-06-25 NOTE — Telephone Encounter (Signed)
 EGD/TIF panel added onto case.   Post TIF follow up scheduled for 09/16/23 at 1:40 pm. Appt information and TIF diet mailed and sent to patient via MyChart.  Es and Joey notified that cTIF approval is obtained and scanned into patient's cahrt under "Media". They will attach the auth to case.

## 2023-06-25 NOTE — Telephone Encounter (Addendum)
 cTIF with [employer] has been approved from 05/25/23-11/26/23. Authorization Number: [id number] Code: [id number] Facility: [facility]

## 2023-06-29 ENCOUNTER — Other Ambulatory Visit: Payer: Self-pay

## 2023-06-29 DIAGNOSIS — K219 Gastro-esophageal reflux disease without esophagitis: Secondary | ICD-10-CM

## 2023-06-29 DIAGNOSIS — K449 Diaphragmatic hernia without obstruction or gangrene: Secondary | ICD-10-CM

## 2023-08-11 NOTE — Patient Instructions (Addendum)
 SURGICAL WAITING ROOM VISITATION Patients having surgery or a procedure may have no more than 2 support people in the waiting area - these visitors may rotate.    Children under the age of 12 must have an adult with them who is not the patient.  If the patient needs to stay at the hospital during part of their recovery, the visitor guidelines for inpatient rooms apply. Pre-op nurse will coordinate an appropriate time for 1 support person to accompany patient in pre-op.  This support person may not rotate.    Please refer to the [facility] website for the visitor guidelines for Inpatients (after your surgery is over and you are in a regular room).       Your procedure is scheduled on: 08-27-23   Report to [facility] Main Entrance    Report to admitting at 8:45 AM   Call this number if you have problems the morning of surgery [phone]   Do not eat food :After Midnight.   After Midnight you may have the following liquids until 8:00 AM DAY OF SURGERY  Water Non-Citrus Juices (without pulp, NO RED-Apple, White grape, White cranberry) Black Coffee (NO MILK/CREAM OR CREAMERS, sugar ok)  Clear Tea (NO MILK/CREAM OR CREAMERS, sugar ok) regular and decaf                             Plain Jell-O (NO RED)                                           Fruit ices (not with fruit pulp, NO RED)                                     Popsicles (NO RED)                                                               Sports drinks like Gatorade (NO RED)                        If you have questions, please contact your surgeon's office.   FOLLOW BANY ADDITIONAL PRE OP INSTRUCTIONS YOU RECEIVED FROM YOUR SURGEON'S OFFICE!!!     Oral Hygiene is also important to reduce your risk of infection.                                    Remember - BRUSH YOUR TEETH THE MORNING OF SURGERY WITH YOUR REGULAR TOOTHPASTE   Do NOT smoke after Midnight   Take these medicines the morning of surgery with A SIP  OF WATER:    Tylenol  if needed  Stop all vitamins and herbal supplements 7 days before surgery                              You may not have any metal on your body including hair pins, jewelry, and body  piercing             Do not wear make-up, lotions, powders, perfumes or deodorant  Do not wear nail polish including gel and S&S, artificial/acrylic nails, or any other type of covering on natural nails including finger and toenails. If you have artificial nails, gel coating, etc. that needs to be removed by a nail salon please have this removed prior to surgery or surgery may need to be canceled/ delayed if the surgeon/ anesthesia feels like they are unable to be safely monitored.   Do not shave  48 hours prior to surgery.    Do not bring valuables to the hospital. [facility] IS NOT RESPONSIBLE   FOR VALUABLES.   Contacts, dentures or bridgework may not be worn into surgery.   Bring small overnight bag day of surgery.   DO NOT BRING YOUR HOME MEDICATIONS TO THE HOSPITAL. PHARMACY WILL DISPENSE MEDICATIONS LISTED ON YOUR MEDICATION LIST TO YOU DURING YOUR ADMISSION IN THE HOSPITAL!   Special Instructions: Bring a copy of your healthcare power of attorney and living will documents the day of surgery if you haven't scanned them before.              Please read over the following fact sheets you were given: IF YOU HAVE QUESTIONS ABOUT YOUR PRE-OP INSTRUCTIONS PLEASE CALL [phone] Gwen  If you received a COVID test during your pre-op visit  it is requested that you wear a mask when out in public, stay away from anyone that may not be feeling well and notify your surgeon if you develop symptoms. If you test positive for Covid or have been in contact with anyone that has tested positive in the last 10 days please notify you surgeon.  [place] - Preparing for Surgery Before surgery, you can play an important role.  Because skin is not sterile, your skin needs to be as free of germs as  possible.  You can reduce the number of germs on your skin by washing with CHG (chlorahexidine gluconate) soap before surgery.  CHG is an antiseptic cleaner which kills germs and bonds with the skin to continue killing germs even after washing. Please DO NOT use if you have an allergy to CHG or antibacterial soaps.  If your skin becomes reddened/irritated stop using the CHG and inform your nurse when you arrive at Short Stay. Do not shave (including legs and underarms) for at least 48 hours prior to the first CHG shower.  You may shave your face/neck.  Please follow these instructions carefully:  1.  Shower with CHG Soap the night before surgery and the  morning of surgery.  2.  If you choose to wash your hair, wash your hair first as usual with your normal  shampoo.  3.  After you shampoo, rinse your hair and body thoroughly to remove the shampoo.                             4.  Use CHG as you would any other liquid soap.  You can apply chg directly to the skin and wash.  Gently with a scrungie or clean washcloth.  5.  Apply the CHG Soap to your body ONLY FROM THE NECK DOWN.   Do   not use on face/ open  Wound or open sores. Avoid contact with eyes, ears mouth and   genitals (private parts).                       Wash face,  Genitals (private parts) with your normal soap.             6.  Wash thoroughly, paying special attention to the area where your    surgery  will be performed.  7.  Thoroughly rinse your body with warm water from the neck down.  8.  DO NOT shower/wash with your normal soap after using and rinsing off the CHG Soap.                9.  Pat yourself dry with a clean towel.            10.  Wear clean pajamas.            11.  Place clean sheets on your bed the night of your first shower and do not  sleep with pets. Day of Surgery : Do not apply any lotions/deodorants the morning of surgery.  Please wear clean clothes to the hospital/surgery  center.  FAILURE TO FOLLOW THESE INSTRUCTIONS MAY RESULT IN THE CANCELLATION OF YOUR SURGERY  PATIENT SIGNATURE_________________________________  NURSE SIGNATURE__________________________________  ________________________________________________________________________

## 2023-08-13 NOTE — Progress Notes (Addendum)
 COVID Vaccine Completed:  yes  Date of COVID positive in last 90 days:  No  PCP - Imelda Man, MD Cardiologist - Maudine Sos, MD  Chest x-ray - CT chest 10-29-22 Epic EKG - 02-10-23 Epic Stress Test - N/A ECHO - N/A Cardiac Cath - N/A Pacemaker/ICD device last checked: Spinal Cord Stimulator: N/A Cardiac CT - 10-12-22 Epic  Bowel Prep - N/A  Sleep Study - N/A CPAP -   Fasting Blood Sugar - N/A Checks Blood Sugar _____ times a day  Last dose of GLP1 agonist-  N/A GLP1 instructions:  Hold 7 days before surgery    Last dose of SGLT-2 inhibitors-  N/A SGLT-2 instructions:  Hold 3 days before surgery   Blood Thinner Instructions:  N/A: Aspirin Instructions: Last Dose:  Activity level:  Can go up a flight of stairs and perform activities of daily living without stopping and without symptoms of chest pain or shortness of breath.  Anesthesia review:  Murmur, CAD with elevated coronary calcium  score.  BP elevated at PAT 160/61 and on recheck 163/63.  Patient denied any associated symptoms.  Spoke to Gamaliel at Dr. Ruddy Corral office and advised that patient needs cardiac clearance  Patient denies shortness of breath, fever, cough and chest pain at PAT appointment  Patient verbalized understanding of instructions that were given to them at the PAT appointment. Patient was also instructed that they will need to review over the PAT instructions again at home before surgery.

## 2023-08-17 ENCOUNTER — Ambulatory Visit: Payer: Self-pay | Admitting: General Surgery

## 2023-08-17 DIAGNOSIS — K449 Diaphragmatic hernia without obstruction or gangrene: Secondary | ICD-10-CM

## 2023-08-17 NOTE — Progress Notes (Signed)
 Surgery orders requested via Epic inbox.

## 2023-08-19 ENCOUNTER — Telehealth: Payer: Self-pay | Admitting: Gastroenterology

## 2023-08-19 NOTE — Telephone Encounter (Signed)
 Returned call to patient & she stated she was seen today at the dentist d/t a throbbing pain behind one of her teeth. History of a root canal in 2021. XR showed pt has bacteria behind her tooth, root is okay. Dr. Susanna Epley is unsure how to treat it just yet, he is sending XR off to a radiologist in [place]. No antibiotics currently. She stated Dr. Susanna Epley is requesting to consult with Dr. Karene Oto prior to her TIF next week 08/27/23. Contact number [phone].

## 2023-08-19 NOTE — Telephone Encounter (Signed)
 Inbound call PT recently saw dentist and they found bacteria in her gums. PT is having hernia surgery on 6/2. Dentist would like to have a verbal meeting to discuss next steps in regards to the surgery. She is not on antibiotics as of right now. Please call Dr. Susanna Epley [phone]

## 2023-08-20 NOTE — Telephone Encounter (Signed)
 Inbound call from Dr. Susanna Epley advising patient is able to proceed with 6/6 TIF procedure. Stated continuing with procedure will not affect infected tooth. Also stated he will contact patient after she completes procedure to discuss plan of care for tooth. Please advise, thank you.

## 2023-08-23 ENCOUNTER — Other Ambulatory Visit: Payer: Self-pay

## 2023-08-23 ENCOUNTER — Telehealth (HOSPITAL_BASED_OUTPATIENT_CLINIC_OR_DEPARTMENT_OTHER): Payer: Self-pay | Admitting: *Deleted

## 2023-08-23 ENCOUNTER — Telehealth: Payer: Self-pay | Admitting: *Deleted

## 2023-08-23 ENCOUNTER — Encounter (HOSPITAL_COMMUNITY)
Admission: RE | Admit: 2023-08-23 | Discharge: 2023-08-23 | Disposition: A | Discharge status: Home / Self Care | Source: Ambulatory Visit | Attending: General Surgery | Admitting: General Surgery

## 2023-08-23 ENCOUNTER — Encounter (HOSPITAL_COMMUNITY): Payer: Self-pay

## 2023-08-23 VITALS — BP 163/63 | HR 51 | Temp 98.2°F | Resp 16 | Ht 69.0 in | Wt 186.6 lb

## 2023-08-23 DIAGNOSIS — K449 Diaphragmatic hernia without obstruction or gangrene: Secondary | ICD-10-CM | POA: Diagnosis not present

## 2023-08-23 DIAGNOSIS — Z01812 Encounter for preprocedural laboratory examination: Secondary | ICD-10-CM | POA: Diagnosis present

## 2023-08-23 DIAGNOSIS — Z01818 Encounter for other preprocedural examination: Secondary | ICD-10-CM

## 2023-08-23 DIAGNOSIS — Z87891 Personal history of nicotine dependence: Secondary | ICD-10-CM | POA: Diagnosis not present

## 2023-08-23 DIAGNOSIS — K219 Gastro-esophageal reflux disease without esophagitis: Secondary | ICD-10-CM | POA: Diagnosis not present

## 2023-08-23 DIAGNOSIS — I2510 Atherosclerotic heart disease of native coronary artery without angina pectoris: Secondary | ICD-10-CM | POA: Insufficient documentation

## 2023-08-23 HISTORY — DX: Personal history of other diseases of the digestive system: Z87.19

## 2023-08-23 HISTORY — DX: Migraine, unspecified, not intractable, without status migrainosus: G43.909

## 2023-08-23 HISTORY — DX: Family history of other specified conditions: Z84.89

## 2023-08-23 HISTORY — DX: Cardiac murmur, unspecified: R01.1

## 2023-08-23 LAB — COMPREHENSIVE METABOLIC PANEL WITH GFR
ALT: 30 U/L (ref 0–44)
AST: 33 U/L (ref 15–41)
Albumin: 3.9 g/dL (ref 3.5–5.0)
Alkaline Phosphatase: 52 U/L (ref 38–126)
Anion gap: 8 (ref 5–15)
BUN: 15 mg/dL (ref 8–23)
CO2: 25 mmol/L (ref 22–32)
Calcium: 9.4 mg/dL (ref 8.9–10.3)
Chloride: 107 mmol/L (ref 98–111)
Creatinine, Ser: 0.73 mg/dL (ref 0.44–1.00)
GFR, Estimated: >60 mL/min
Glucose, Bld: 109 mg/dL — ABNORMAL HIGH (ref 70–99)
Potassium: 3.8 mmol/L (ref 3.5–5.1)
Sodium: 140 mmol/L (ref 135–145)
Total Bilirubin: 0.4 mg/dL (ref 0.0–1.2)
Total Protein: 6.8 g/dL (ref 6.5–8.1)

## 2023-08-23 LAB — CBC WITH DIFFERENTIAL/PLATELET
Abs Immature Granulocytes: 0.02 10*3/uL (ref 0.00–0.07)
Basophils Absolute: 0 10*3/uL (ref 0.0–0.1)
Basophils Relative: 1 %
Eosinophils Absolute: 0.2 10*3/uL (ref 0.0–0.5)
Eosinophils Relative: 3 %
HCT: 38.7 % (ref 36.0–46.0)
Hemoglobin: 12.4 g/dL (ref 12.0–15.0)
Immature Granulocytes: 0 %
Lymphocytes Relative: 25 %
Lymphs Abs: 1.5 10*3/uL (ref 0.7–4.0)
MCH: 29.2 pg (ref 26.0–34.0)
MCHC: 32 g/dL (ref 30.0–36.0)
MCV: 91.1 fL (ref 80.0–100.0)
Monocytes Absolute: 0.5 10*3/uL (ref 0.1–1.0)
Monocytes Relative: 8 %
Neutro Abs: 3.8 10*3/uL (ref 1.7–7.7)
Neutrophils Relative %: 63 %
Platelets: 235 10*3/uL (ref 150–400)
RBC: 4.25 MIL/uL (ref 3.87–5.11)
RDW: 13.5 % (ref 11.5–15.5)
WBC: 6 10*3/uL (ref 4.0–10.5)
nRBC: 0 % (ref 0.0–0.2)

## 2023-08-23 NOTE — Telephone Encounter (Signed)
   Name: Casey Woods  DOB: Sep 21, 1945  MRN: [id number]  Primary Cardiologist: Maudine Sos, MD   Preoperative team, please contact this patient and set up a phone call appointment for further preoperative risk assessment. Please obtain consent and complete medication review. Thank you for your help.  I confirm that guidance regarding antiplatelet and oral anticoagulation therapy has been completed and, if necessary, noted below. None requested.   I also confirmed the patient resides in the state of [place] . As per [facility] Medical Board telemedicine laws, the patient must reside in the state in which the provider is licensed.   Alyssia Heese D Caoimhe Damron, NP 08/23/2023, 3:19 PM [facility]

## 2023-08-23 NOTE — Telephone Encounter (Signed)
  Pt is calling back to schedule televisit appt for preop. Pt is scheduled on Friday 08/27/23 for her procedure

## 2023-08-23 NOTE — Telephone Encounter (Signed)
   Pre-operative Risk Assessment    Patient Name: Casey Woods  DOB: 1946/03/10 MRN: [id number]   Date of last office visit: 02/10/2023 Date of next office visit: None  Request for Surgical Clearance    Procedure:  Hiatal Hernia  Date of Surgery:  Clearance TBD                                 Surgeon:  Dr. Aldean Hummingbird Surgeon's Group or Practice Name:  [facility] Phone number:  [phone] Fax number:  [phone]   Type of Clearance Requested:   - Medical    Type of Anesthesia:  General    Additional requests/questions:    Signed, Lauris Port   08/23/2023, 3:05 PM

## 2023-08-23 NOTE — Telephone Encounter (Signed)
 Pt has been added on 08/25/23 for tele preop appt due to procedure date 08/27/23. Our office just received this request today. Med rec and consent are done.     Patient Consent for Virtual Visit        Casey Woods has provided verbal consent on 08/23/2023 for a virtual visit (video or telephone).   CONSENT FOR VIRTUAL VISIT FOR:  Casey Woods  By participating in this virtual visit I agree to the following:  I hereby voluntarily request, consent and authorize [facility] and its employed or contracted physicians, physician assistants, nurse practitioners or other licensed health care professionals (the Practitioner), to provide me with telemedicine health care services (the "Services") as deemed necessary by the treating Practitioner. I acknowledge and consent to receive the Services by the Practitioner via telemedicine. I understand that the telemedicine visit will involve communicating with the Practitioner through live audiovisual communication technology and the disclosure of certain medical information by electronic transmission. I acknowledge that I have been given the opportunity to request an in-person assessment or other available alternative prior to the telemedicine visit and am voluntarily participating in the telemedicine visit.  I understand that I have the right to withhold or withdraw my consent to the use of telemedicine in the course of my care at any time, without affecting my right to future care or treatment, and that the Practitioner or I may terminate the telemedicine visit at any time. I understand that I have the right to inspect all information obtained and/or recorded in the course of the telemedicine visit and may receive copies of available information for a reasonable fee.  I understand that some of the potential risks of receiving the Services via telemedicine include:  Delay or interruption in medical evaluation due to technological equipment failure or  disruption; Information transmitted may not be sufficient (e.g. poor resolution of images) to allow for appropriate medical decision making by the Practitioner; and/or  In rare instances, security protocols could fail, causing a breach of personal health information.  Furthermore, I acknowledge that it is my responsibility to provide information about my medical history, conditions and care that is complete and accurate to the best of my ability. I acknowledge that Practitioner's advice, recommendations, and/or decision may be based on factors not within their control, such as incomplete or inaccurate data provided by me or distortions of diagnostic images or specimens that may result from electronic transmissions. I understand that the practice of medicine is not an exact science and that Practitioner makes no warranties or guarantees regarding treatment outcomes. I acknowledge that a copy of this consent can be made available to me via my patient portal [facility] MyChart), or I can request a printed copy by calling the office of [facility].    I understand that my insurance will be billed for this visit.   I have read or had this consent read to me. I understand the contents of this consent, which adequately explains the benefits and risks of the Services being provided via telemedicine.  I have been provided ample opportunity to ask questions regarding this consent and the Services and have had my questions answered to my satisfaction. I give my informed consent for the services to be provided through the use of telemedicine in my medical care

## 2023-08-23 NOTE — Progress Notes (Signed)
 Surgery orders requested with Dr. Andre Kawasaki office.

## 2023-08-23 NOTE — Telephone Encounter (Signed)
 Pt has been added on 08/25/23 for tele preop appt due to procedure date 08/27/23. Our office just received this request today. Med rec and consent are done.

## 2023-08-24 NOTE — Addendum Note (Signed)
 Addended by: Marketia Stallsmith N on: 08/24/2023 08:17 AM   Modules accepted: Orders

## 2023-08-25 ENCOUNTER — Ambulatory Visit: Attending: Cardiology

## 2023-08-25 DIAGNOSIS — Z01810 Encounter for preprocedural cardiovascular examination: Secondary | ICD-10-CM | POA: Diagnosis not present

## 2023-08-25 NOTE — Telephone Encounter (Signed)
 MyChart message sent to patient.

## 2023-08-25 NOTE — Progress Notes (Signed)
 Virtual Visit via Telephone Note   Because of Casey Woods co-morbid illnesses, she is at least at moderate risk for complications without adequate follow up.  This format is felt to be most appropriate for this patient at this time.  Due to technical limitations with video connection (technology), today's appointment will be conducted as an audio only telehealth visit, and Casey Woods verbally agreed to proceed in this manner.   All issues noted in this document were discussed and addressed.  No physical exam could be performed with this format.  Evaluation Performed:  Preoperative cardiovascular risk assessment _____________   Date:  08/25/2023   Patient ID:  Casey Woods, DOB 30-Jun-1945, MRN [id number] Patient Location:  Home Provider location:   Office  Primary Care Provider:  Imelda Man, MD Primary Cardiologist:  Casey Sos, MD  Chief Complaint / Patient Profile  78 y.o. y/o female with a h/o CAD with elevated coronary calcium  score of 545, dyslipidemia, GERD and known heart murmur who is pending hiatal hernia repair and presents today for telephonic preoperative cardiovascular risk assessment. History of Present Illness  Casey Woods is a 78 y.o. female who presents via audio/video conferencing for a telehealth visit today.  Pt was last seen in cardiology clinic on 02/10/23 by Casey Duncan, NP.  At that time Casey Woods was doing well.  The patient is now pending procedure as outlined above. Since her last visit, she has remained stable from a cardiac standpoint.  She is able to achieve greater than 4 METS of activity, she attends the gym daily and walks a mile.Today she denies chest pain, shortness of breath, lower extremity edema, fatigue, palpitations, melena, hematuria, hemoptysis, diaphoresis, weakness, presyncope, syncope, orthopnea, and PND.  Past Medical History    Past Medical History:  Diagnosis Date   Coronary artery abnormality    Edema leg     left > right   Elevated cholesterol    Family history of adverse reaction to anesthesia    Sick nausea, vomiting   GERD (gastroesophageal reflux disease)    Heart murmur    History of hiatal hernia    Migraine headache    Venous insufficiency (chronic) (peripheral)    Past Surgical History:  Procedure Laterality Date   ABDOMINAL HYSTERECTOMY     BUNIONECTOMY Bilateral    COLONOSCOPY     LASER ABLATION     right - varicose veins   LEG SURGERY     UPPER GI ENDOSCOPY     Allergies Allergies  Allergen Reactions   Beef-Derived Drug Products     headache   Rosuvastatin  Calcium  Other (See Comments)    Lethargy, joint pain, aching   Dexlansoprazole Rash   Esomeprazole Rash   Voquezna Dual Pak [Amoxicillin & Vonoprazan] Rash   Home Medications    Prior to Admission medications   Medication Sig Start Date End Date Taking? Authorizing Provider  acetaminophen  (TYLENOL ) 500 MG tablet Take 500 mg by mouth every 6 (six) hours as needed for moderate pain (pain score 4-6).    [provider]  CALCIUM -MAGNESIUM-ZINC PO Take 1 tablet by mouth in the morning and at bedtime.    [provider]  Cholecalciferol (VITAMIN D3) 1000 units CAPS Take 1,000 Units by mouth daily.    [provider]  clotrimazole-betamethasone (LOTRISONE) lotion Apply 1 Application topically 2 (two) times daily as needed (irritation).    [provider]  estradiol  (ESTRACE  VAGINAL) 0.1 MG/GM vaginal cream Place 1  Applicatorful vaginally at bedtime. Apply a dime size amount daily to the vagina every night for 3 weeks then use 3 times a week thereafter Patient not taking: Reported on 08/11/2023 05/27/23   Casey Caras, MD  mometasone (ELOCON) 0.1 % cream Apply 1 Application topically 2 (two) times daily as needed (irritation). 08/01/21   [provider]  Multiple Vitamins-Minerals (MULTIVITAMIN WITH MINERALS) tablet Take 1 tablet by mouth daily.    [provider]   nystatin -triamcinolone  ointment (MYCOLOG) Apply 1 Application topically daily. Patient taking differently: Apply 1 Application topically daily as needed (yeast). 09/02/22   Woods, Marie-Lyne, MD  Prasterone  (INTRAROSA ) 6.5 MG INST Place 1 suppository vaginally at bedtime as needed. Patient not taking: Reported on 08/11/2023 05/21/23   Casey Caras, MD  pravastatin  (PRAVACHOL ) 40 MG tablet TAKE 1 TABLET BY MOUTH EVERY DAY IN THE EVENING 05/10/23   Casey Sos, MD   Physical Exam  Vital Signs:  Casey Woods does not have vital signs available for review today. Given telephonic nature of communication, physical exam is limited. AAOx3. NAD. Normal affect.  Speech and respirations are unlabored. Accessory Clinical Findings  None Assessment & Plan    1.  Preoperative Cardiovascular Risk Assessment: [name]'s perioperative risk of a major cardiac event is 0.4% according to the Revised Cardiac Risk Index (RCRI).  Therefore, she is at low risk for perioperative complications.   Her functional capacity is excellent at 7.34 METs according to the Duke Activity Status Index (DASI). Recommendations: According to ACC/AHA guidelines, no further cardiovascular testing needed.  The patient may proceed to surgery at acceptable risk.   Antiplatelet and/or Anticoagulation Recommendations: None requested    The patient was advised that if she develops new symptoms prior to surgery to contact our office to arrange for a follow-up visit, and she verbalized understanding.  A copy of this note will be routed to requesting surgeon.  Time:   Today, I have spent 11 minutes with the patient with telehealth technology discussing medical history, symptoms, and management plan.    Casey Woods D Maeve Debord, NP  08/25/2023, 1:14 PM

## 2023-08-26 ENCOUNTER — Encounter (HOSPITAL_COMMUNITY): Payer: Self-pay

## 2023-08-26 NOTE — Anesthesia Preprocedure Evaluation (Signed)
 Anesthesia Evaluation  Patient identified by MRN, date of birth, ID band Patient awake    Reviewed: Allergy & Precautions, NPO status , Patient's Chart, lab work & pertinent test results  Airway Mallampati: II  TM Distance: >3 FB Neck ROM: Full    Dental no notable dental hx.    Pulmonary neg pulmonary ROS, former smoker   Pulmonary exam normal        Cardiovascular + CAD   Rhythm:Regular Rate:Normal     Neuro/Psych  Headaches  negative psych ROS   GI/Hepatic Neg liver ROS, hiatal hernia,GERD  ,,  Endo/Other  negative endocrine ROS    Renal/GU negative Renal ROS  negative genitourinary   Musculoskeletal negative musculoskeletal ROS (+)    Abdominal Normal abdominal exam  (+)   Peds  Hematology Lab Results      Component                Value               Date                      WBC                      6.0                 08/23/2023                HGB                      12.4                08/23/2023                HCT                      38.7                08/23/2023                MCV                      91.1                08/23/2023                PLT                      235                 08/23/2023              Anesthesia Other Findings   Reproductive/Obstetrics                             Anesthesia Physical Anesthesia Plan  ASA: 2  Anesthesia Plan: General   Post-op Pain Management: Tylenol  PO (pre-op)* and Celebrex PO (pre-op)*   Induction: Intravenous  PONV Risk Score and Plan: 3 and Ondansetron, Dexamethasone and Treatment may vary due to age or medical condition  Airway Management Planned: Mask and Oral ETT  Additional Equipment: None  Intra-op Plan:   Post-operative Plan: Extubation in OR  Informed Consent: I have reviewed the patients History and Physical, chart, labs and discussed the procedure including the risks, benefits and alternatives for the  proposed anesthesia with  the patient or authorized representative who has indicated his/her understanding and acceptance.     Dental advisory given  Plan Discussed with: CRNA  Anesthesia Plan Comments: (See PAT note from 6/2)        Anesthesia Quick Evaluation

## 2023-08-26 NOTE — Progress Notes (Signed)
 Case: [id number] Date/Time: 08/27/23 0715   Procedures:      REPAIR, HERNIA, HIATAL, LAPAROSCOPIC     FUNDOPLICATION, STOMACH, INCISIONLESS, ORAL APPROACH     EGD (ESOPHAGOGASTRODUODENOSCOPY)   Anesthesia type: General   Diagnosis:      Hiatal hernia [K44.9]     Gastroesophageal reflux disease without esophagitis [K21.9]   Pre-op diagnosis: HIATAL HERNIA   Location: [facility] ROOM 01 / [facility]   Surgeons: Aldean Hummingbird, MD; Annis Kinder, DO       DISCUSSION: Casey Woods is a 78 yo female who presents to PAT prior to surgery above. PMH of former smoking, CAD (by CT), heart murmur, migraine, GERD, hiatal hernia.  Patient follows with Cardiology for hx of CAD by CT (elevated calcium  score). Seen on 6/4 for cardiac clearance. She denied any symptoms and can do >4 METS. Cleared for surgery:  "Preoperative Cardiovascular Risk Assessment: Ms. Leisinger's perioperative risk of a major cardiac event is 0.4% according to the Revised Cardiac Risk Index (RCRI).  Therefore, she is at low risk for perioperative complications.   Her functional capacity is excellent at 7.34 METs according to the Duke Activity Status Index (DASI). Recommendations: According to ACC/AHA guidelines, no further cardiovascular testing needed.  The patient may proceed to surgery at acceptable risk."  VS: BP (!) 163/63   Pulse (!) 51   Temp 36.8 C (Oral)   Resp 16   Ht 5\' 9"  (1.753 m)   Wt 84.6 kg   SpO2 100%   BMI 27.56 kg/m   PROVIDERS: Imelda Man, MD   LABS: Labs reviewed: Acceptable for surgery. (all labs ordered are listed, but only abnormal results are displayed)  Labs Reviewed  COMPREHENSIVE METABOLIC PANEL WITH GFR - Abnormal; Notable for the following components:      Result Value   Glucose, Bld 109 (*)    All other components within normal limits  CBC WITH DIFFERENTIAL/PLATELET     IMAGES:   EKG 02/10/23:  Sinus bradycardia, rate 46  CV:  CT Calcium  score 10/12/22:  IMPRESSION: 1.  Coronary calcium  score of 545. This was 87th percentile for age-, race-, and sex-matched controls.  Past Medical History:  Diagnosis Date   Coronary artery abnormality    Edema leg    left > right   Elevated cholesterol    Family history of adverse reaction to anesthesia    Sick nausea, vomiting   GERD (gastroesophageal reflux disease)    Heart murmur    History of hiatal hernia    Migraine headache    Venous insufficiency (chronic) (peripheral)     Past Surgical History:  Procedure Laterality Date   ABDOMINAL HYSTERECTOMY     BUNIONECTOMY Bilateral    COLONOSCOPY     LASER ABLATION     right - varicose veins   LEG SURGERY     UPPER GI ENDOSCOPY      MEDICATIONS:  acetaminophen  (TYLENOL ) 500 MG tablet   CALCIUM -MAGNESIUM-ZINC PO   Cholecalciferol (VITAMIN D3) 1000 units CAPS   clotrimazole-betamethasone (LOTRISONE) lotion   estradiol  (ESTRACE  VAGINAL) 0.1 MG/GM vaginal cream   mometasone (ELOCON) 0.1 % cream   Multiple Vitamins-Minerals (MULTIVITAMIN WITH MINERALS) tablet   nystatin -triamcinolone  ointment (MYCOLOG)   Prasterone  (INTRAROSA ) 6.5 MG INST   pravastatin  (PRAVACHOL ) 40 MG tablet   No current facility-administered medications for this encounter.   Antoinette Kirschner [facility] Short Stay/Anesthesiology [facility] Phone [phone] 08/26/2023 9:09 AM

## 2023-08-27 ENCOUNTER — Other Ambulatory Visit: Payer: Self-pay

## 2023-08-27 ENCOUNTER — Ambulatory Visit (HOSPITAL_BASED_OUTPATIENT_CLINIC_OR_DEPARTMENT_OTHER): Payer: Self-pay | Admitting: Vascular Surgery

## 2023-08-27 ENCOUNTER — Encounter (HOSPITAL_COMMUNITY)
Admission: RE | Disposition: A | Discharge status: Home / Self Care | Payer: Self-pay | Source: Home / Self Care | Attending: General Surgery

## 2023-08-27 ENCOUNTER — Encounter (HOSPITAL_COMMUNITY): Payer: Self-pay | Admitting: General Surgery

## 2023-08-27 ENCOUNTER — Observation Stay (HOSPITAL_COMMUNITY)
Admission: RE | Admit: 2023-08-27 | Discharge: 2023-08-28 | Disposition: A | Discharge status: Home / Self Care | Attending: General Surgery | Admitting: General Surgery

## 2023-08-27 ENCOUNTER — Ambulatory Visit (HOSPITAL_COMMUNITY): Payer: Self-pay | Admitting: Vascular Surgery

## 2023-08-27 DIAGNOSIS — I2510 Atherosclerotic heart disease of native coronary artery without angina pectoris: Secondary | ICD-10-CM

## 2023-08-27 DIAGNOSIS — R12 Heartburn: Secondary | ICD-10-CM | POA: Diagnosis not present

## 2023-08-27 DIAGNOSIS — K449 Diaphragmatic hernia without obstruction or gangrene: Principal | ICD-10-CM

## 2023-08-27 DIAGNOSIS — K219 Gastro-esophageal reflux disease without esophagitis: Principal | ICD-10-CM

## 2023-08-27 DIAGNOSIS — Z48815 Encounter for surgical aftercare following surgery on the digestive system: Secondary | ICD-10-CM | POA: Insufficient documentation

## 2023-08-27 DIAGNOSIS — Z01818 Encounter for other preprocedural examination: Secondary | ICD-10-CM

## 2023-08-27 HISTORY — PX: ESOPHAGOGASTRODUODENOSCOPY: SHX5428

## 2023-08-27 HISTORY — PX: HIATAL HERNIA REPAIR: SHX195

## 2023-08-27 HISTORY — PX: TRANSORAL INCISIONLESS FUNDOPLICATION: SHX6840

## 2023-08-27 SURGERY — REPAIR, HERNIA, HIATAL, LAPAROSCOPIC
Anesthesia: General

## 2023-08-27 MED ORDER — LIDOCAINE HCL (CARDIAC) PF 100 MG/5ML IV SOSY
PREFILLED_SYRINGE | INTRAVENOUS | Status: DC | PRN
Start: 2023-08-27 — End: 2023-08-27
  Administered 2023-08-27: 30 mg via INTRATRACHEAL

## 2023-08-27 MED ORDER — ROCURONIUM BROMIDE 10 MG/ML (PF) SYRINGE
PREFILLED_SYRINGE | INTRAVENOUS | Status: AC
  Filled 2023-08-27: qty 10

## 2023-08-27 MED ORDER — PROPOFOL 10 MG/ML IV BOLUS
INTRAVENOUS | Status: AC
Start: 2023-08-27 — End: ?
  Filled 2023-08-27: qty 20

## 2023-08-27 MED ORDER — EPHEDRINE SULFATE-NACL 50-0.9 MG/10ML-% IV SOSY
PREFILLED_SYRINGE | INTRAVENOUS | Status: DC | PRN
  Administered 2023-08-27 (×2): 10 mg via INTRAVENOUS
  Administered 2023-08-27: 15 mg via INTRAVENOUS

## 2023-08-27 MED ORDER — LIDOCAINE HCL (PF) 2 % IJ SOLN
INTRAMUSCULAR | Status: AC
  Filled 2023-08-27: qty 5

## 2023-08-27 MED ORDER — LIDOCAINE HCL (PF) 2 % IJ SOLN
INTRAMUSCULAR | Status: DC | PRN
  Administered 2023-08-27: 1.5 mg/kg/h via INTRADERMAL

## 2023-08-27 MED ORDER — VANCOMYCIN HCL IN DEXTROSE 1-5 GM/200ML-% IV SOLN
1000.0000 mg | INTRAVENOUS | Status: AC
  Administered 2023-08-27: 1000 mg via INTRAVENOUS
  Filled 2023-08-27: qty 200

## 2023-08-27 MED ORDER — MINERAL OIL LIGHT OIL
TOPICAL_OIL | Status: AC
  Filled 2023-08-27: qty 20

## 2023-08-27 MED ORDER — ONDANSETRON HCL 4 MG/2ML IJ SOLN
INTRAMUSCULAR | Status: AC
  Filled 2023-08-27: qty 2

## 2023-08-27 MED ORDER — DEXAMETHASONE SODIUM PHOSPHATE 10 MG/ML IJ SOLN
8.0000 mg | Freq: Four times a day (QID) | INTRAMUSCULAR | Status: DC
  Administered 2023-08-27 – 2023-08-28 (×5): 8 mg via INTRAVENOUS
  Filled 2023-08-27 (×5): qty 1

## 2023-08-27 MED ORDER — ONDANSETRON HCL 4 MG/2ML IJ SOLN
INTRAMUSCULAR | Status: DC | PRN
  Administered 2023-08-27: 4 mg via INTRAVENOUS

## 2023-08-27 MED ORDER — HEPARIN SODIUM (PORCINE) 5000 UNIT/ML IJ SOLN
5000.0000 [IU] | Freq: Three times a day (TID) | INTRAMUSCULAR | Status: DC
  Administered 2023-08-28: 5000 [IU] via SUBCUTANEOUS
  Filled 2023-08-27 (×2): qty 1

## 2023-08-27 MED ORDER — EPHEDRINE 5 MG/ML INJ
INTRAVENOUS | Status: AC
  Filled 2023-08-27: qty 5

## 2023-08-27 MED ORDER — HEPARIN SODIUM (PORCINE) 5000 UNIT/ML IJ SOLN
5000.0000 [IU] | Freq: Once | INTRAMUSCULAR | Status: AC
  Administered 2023-08-27: 5000 [IU] via SUBCUTANEOUS
  Filled 2023-08-27: qty 1

## 2023-08-27 MED ORDER — ACETAMINOPHEN-CODEINE 300-30 MG PO TABS: 1.0000 | ORAL_TABLET | ORAL | Status: DC | PRN

## 2023-08-27 MED ORDER — ORAL CARE MOUTH RINSE: 15.0000 mL | Freq: Once | OROMUCOSAL | Status: AC

## 2023-08-27 MED ORDER — DROPERIDOL 2.5 MG/ML IJ SOLN: 0.6250 mg | Freq: Once | INTRAMUSCULAR | Status: DC | PRN

## 2023-08-27 MED ORDER — ACETAMINOPHEN 500 MG PO TABS: 1000.0000 mg | ORAL_TABLET | Freq: Once | ORAL | Status: AC

## 2023-08-27 MED ORDER — SODIUM CHLORIDE 0.9 % IV SOLN: INTRAVENOUS | Status: AC

## 2023-08-27 MED ORDER — SODIUM CHLORIDE 0.9 % IR SOLN
Status: DC | PRN
  Administered 2023-08-27: 1000 mL

## 2023-08-27 MED ORDER — LACTATED RINGERS IV SOLN: INTRAVENOUS | Status: DC

## 2023-08-27 MED ORDER — METOCLOPRAMIDE HCL 5 MG/ML IJ SOLN
10.0000 mg | Freq: Four times a day (QID) | INTRAMUSCULAR | Status: DC
  Administered 2023-08-27 – 2023-08-28 (×5): 10 mg via INTRAVENOUS
  Filled 2023-08-27 (×5): qty 2

## 2023-08-27 MED ORDER — DEXAMETHASONE SODIUM PHOSPHATE 10 MG/ML IJ SOLN
INTRAMUSCULAR | Status: AC
  Filled 2023-08-27: qty 1

## 2023-08-27 MED ORDER — ACETAMINOPHEN 500 MG PO TABS
1000.0000 mg | ORAL_TABLET | ORAL | Status: AC
  Administered 2023-08-27: 1000 mg via ORAL
  Filled 2023-08-27: qty 2

## 2023-08-27 MED ORDER — CHLORHEXIDINE GLUCONATE CLOTH 2 % EX PADS: 6.0000 | MEDICATED_PAD | Freq: Once | CUTANEOUS | Status: DC

## 2023-08-27 MED ORDER — BUPIVACAINE-EPINEPHRINE (PF) 0.25% -1:200000 IJ SOLN
INTRAMUSCULAR | Status: AC
  Filled 2023-08-27: qty 30

## 2023-08-27 MED ORDER — SUGAMMADEX SODIUM 200 MG/2ML IV SOLN
INTRAVENOUS | Status: DC | PRN
  Administered 2023-08-27: 200 mg via INTRAVENOUS

## 2023-08-27 MED ORDER — KETAMINE HCL 50 MG/5ML IJ SOSY
PREFILLED_SYRINGE | INTRAMUSCULAR | Status: DC | PRN
Start: 2023-08-27 — End: 2023-08-27
  Administered 2023-08-27: 20 mg via INTRAVENOUS
  Administered 2023-08-27: 30 mg via INTRAVENOUS

## 2023-08-27 MED ORDER — FAMOTIDINE IN NACL 20-0.9 MG/50ML-% IV SOLN
20.0000 mg | Freq: Two times a day (BID) | INTRAVENOUS | Status: DC
  Administered 2023-08-27 – 2023-08-28 (×3): 20 mg via INTRAVENOUS
  Filled 2023-08-27 (×3): qty 50

## 2023-08-27 MED ORDER — LIDOCAINE VISCOUS HCL 2 % MT SOLN: 5.0000 mL | Freq: Three times a day (TID) | OROMUCOSAL | Status: DC | PRN

## 2023-08-27 MED ORDER — ONDANSETRON HCL 4 MG/2ML IJ SOLN
4.0000 mg | Freq: Four times a day (QID) | INTRAMUSCULAR | Status: DC
  Administered 2023-08-27 – 2023-08-28 (×5): 4 mg via INTRAVENOUS
  Filled 2023-08-27 (×5): qty 2

## 2023-08-27 MED ORDER — CHLORHEXIDINE GLUCONATE 0.12 % MT SOLN
15.0000 mL | Freq: Once | OROMUCOSAL | Status: AC
  Administered 2023-08-27: 15 mL via OROMUCOSAL

## 2023-08-27 MED ORDER — BUPIVACAINE LIPOSOME 1.3 % IJ SUSP
INTRAMUSCULAR | Status: DC | PRN
  Administered 2023-08-27: 50 mL

## 2023-08-27 MED ORDER — FENTANYL CITRATE (PF) 100 MCG/2ML IJ SOLN
INTRAMUSCULAR | Status: DC | PRN
  Administered 2023-08-27: 100 ug via INTRAVENOUS
  Administered 2023-08-27 (×2): 50 ug via INTRAVENOUS

## 2023-08-27 MED ORDER — DEXAMETHASONE SODIUM PHOSPHATE 10 MG/ML IJ SOLN
4.0000 mg | INTRAMUSCULAR | Status: AC
  Administered 2023-08-27: 4 mg via INTRAVENOUS

## 2023-08-27 MED ORDER — ROCURONIUM BROMIDE 10 MG/ML (PF) SYRINGE
PREFILLED_SYRINGE | INTRAVENOUS | Status: DC | PRN
Start: 2023-08-27 — End: 2023-08-27
  Administered 2023-08-27: 60 mg via INTRAVENOUS
  Administered 2023-08-27 (×2): 20 mg via INTRAVENOUS

## 2023-08-27 MED ORDER — PHENYLEPHRINE 80 MCG/ML (10ML) SYRINGE FOR IV PUSH (FOR BLOOD PRESSURE SUPPORT)
PREFILLED_SYRINGE | INTRAVENOUS | Status: DC | PRN
  Administered 2023-08-27: 160 ug via INTRAVENOUS

## 2023-08-27 MED ORDER — ACETAMINOPHEN-CODEINE 120-12 MG/5ML PO SOLN
15.0000 mL | ORAL | Status: DC | PRN
  Administered 2023-08-27: 15 mL via ORAL
  Filled 2023-08-27 (×2): qty 15

## 2023-08-27 MED ORDER — PROPOFOL 10 MG/ML IV BOLUS
INTRAVENOUS | Status: DC | PRN
Start: 2023-08-27 — End: 2023-08-27
  Administered 2023-08-27: 140 mg via INTRAVENOUS

## 2023-08-27 MED ORDER — SIMETHICONE 80 MG PO CHEW: 80.0000 mg | CHEWABLE_TABLET | Freq: Four times a day (QID) | ORAL | Status: DC | PRN

## 2023-08-27 MED ORDER — FENTANYL CITRATE (PF) 100 MCG/2ML IJ SOLN
INTRAMUSCULAR | Status: AC
Start: 2023-08-27 — End: ?
  Filled 2023-08-27: qty 2

## 2023-08-27 MED ORDER — LIDOCAINE HCL 2 % IJ SOLN
INTRAMUSCULAR | Status: AC
  Filled 2023-08-27: qty 20

## 2023-08-27 MED ORDER — KETAMINE HCL 50 MG/5ML IJ SOSY
PREFILLED_SYRINGE | INTRAMUSCULAR | Status: AC
  Filled 2023-08-27: qty 5

## 2023-08-27 MED ORDER — TRAMADOL HCL 50 MG PO TABS
50.0000 mg | ORAL_TABLET | Freq: Four times a day (QID) | ORAL | Status: DC | PRN
  Administered 2023-08-27 (×2): 50 mg via ORAL
  Filled 2023-08-27 (×2): qty 1

## 2023-08-27 MED ORDER — BUPIVACAINE LIPOSOME 1.3 % IJ SUSP
INTRAMUSCULAR | Status: AC
  Filled 2023-08-27: qty 20

## 2023-08-27 MED ORDER — HYDROMORPHONE HCL 1 MG/ML IJ SOLN: 0.2500 mg | INTRAMUSCULAR | Status: DC | PRN

## 2023-08-27 MED ORDER — LACTATED RINGERS IR SOLN
Status: DC | PRN
  Administered 2023-08-27: 1000 mL

## 2023-08-27 MED ORDER — CELECOXIB 200 MG PO CAPS
200.0000 mg | ORAL_CAPSULE | Freq: Once | ORAL | Status: AC
  Administered 2023-08-27: 200 mg via ORAL
  Filled 2023-08-27: qty 1

## 2023-08-27 MED ORDER — STERILE WATER FOR IRRIGATION IR SOLN
Status: DC | PRN
  Administered 2023-08-27: 1000 mL

## 2023-08-27 SURGICAL SUPPLY — 49 items
BAG COUNTER SPONGE SURGICOUNT (BAG) IMPLANT
BENZOIN TINCTURE PRP APPL 2/3 (GAUZE/BANDAGES/DRESSINGS) IMPLANT
BLADE SURG SZ11 CARB STEEL (BLADE) ×1 IMPLANT
CABLE HIGH FREQUENCY MONO STRZ (ELECTRODE) IMPLANT
CHLORAPREP W/TINT 26 (MISCELLANEOUS) ×1 IMPLANT
CLIP APPLIE ROT 10 11.4 M/L (STAPLE) IMPLANT
DEVICE SUT QUICK LOAD TK 5 (SUTURE) ×1 IMPLANT
DEVICE SUT TI-KNOT TK 5X26 (SUTURE) ×1 IMPLANT
DEVICE SUTURE ENDOST 10MM (ENDOMECHANICALS) ×1 IMPLANT
DISSECTOR BLUNT TIP ENDO 5MM (MISCELLANEOUS) ×1 IMPLANT
DRAIN PENROSE 0.5X18 (DRAIN) IMPLANT
DRAPE UTILITY XL STRL (DRAPES) ×2 IMPLANT
DRSG TEGADERM 2-3/8X2-3/4 SM (GAUZE/BANDAGES/DRESSINGS) IMPLANT
ELECT REM PT RETURN 15FT ADLT (MISCELLANEOUS) ×1 IMPLANT
GAUZE 4X4 16PLY ~~LOC~~+RFID DBL (SPONGE) ×1 IMPLANT
GAUZE SPONGE 2X2 8PLY STRL LF (GAUZE/BANDAGES/DRESSINGS) IMPLANT
GLOVE BIO SURGEON STRL SZ7.5 (GLOVE) ×1 IMPLANT
GLOVE INDICATOR 8.0 STRL GRN (GLOVE) ×1 IMPLANT
GOWN STRL REUS W/ TWL XL LVL3 (GOWN DISPOSABLE) ×1 IMPLANT
GRASPER SUT TROCAR 14GX15 (MISCELLANEOUS) IMPLANT
IRRIGATION SUCT STRKRFLW 2 WTP (MISCELLANEOUS) IMPLANT
KIT BASIN OR (CUSTOM PROCEDURE TRAY) ×1 IMPLANT
KIT ESOPHYX Z+ (Miscellaneous) ×20 IMPLANT
KIT TURNOVER KIT A (KITS) IMPLANT
NDL HYPO 22X1.5 SAFETY MO (MISCELLANEOUS) ×1 IMPLANT
NEEDLE HYPO 22X1.5 SAFETY MO (MISCELLANEOUS) ×1 IMPLANT
NS IRRIG 1000ML POUR BTL (IV SOLUTION) ×1 IMPLANT
PACK UNIVERSAL I (CUSTOM PROCEDURE TRAY) IMPLANT
SCISSORS LAP 5X45 EPIX DISP (ENDOMECHANICALS) ×1 IMPLANT
SET TUBE SMOKE EVAC HIGH FLOW (TUBING) ×1 IMPLANT
SHEARS HARMONIC 45 ACE (MISCELLANEOUS) ×1 IMPLANT
SLEEVE ADV FIXATION 12X100MM (TROCAR) IMPLANT
SLEEVE ADV FIXATION 5X100MM (TROCAR) ×2 IMPLANT
SOLUTION ANTFG W/FOAM PAD STRL (MISCELLANEOUS) ×1 IMPLANT
SPIKE FLUID TRANSFER (MISCELLANEOUS) ×1 IMPLANT
STRIP CLOSURE SKIN 1/2X4 (GAUZE/BANDAGES/DRESSINGS) IMPLANT
SUT ETHIBOND 2 0 SH 36X2 (SUTURE) IMPLANT
SUT MNCRL AB 4-0 PS2 18 (SUTURE) ×1 IMPLANT
SUT SURGIDAC NAB ES-9 0 48 120 (SUTURE) ×1 IMPLANT
SUT VICRYL 0 UR6 27IN ABS (SUTURE) ×1 IMPLANT
SYR 20ML ECCENTRIC (SYRINGE) ×1 IMPLANT
SYR 20ML LL LF (SYRINGE) ×1 IMPLANT
TIP INNERVISION DETACH 40FR (MISCELLANEOUS) IMPLANT
TIP INNERVISION DETACH 50FR (MISCELLANEOUS) IMPLANT
TIP INNERVISION DETACH 56FR (MISCELLANEOUS) IMPLANT
TOWEL OR 17X26 10 PK STRL BLUE (TOWEL DISPOSABLE) ×1 IMPLANT
TROCAR ADV FIXATION 12X100MM (TROCAR) ×1 IMPLANT
TROCAR ADV FIXATION 5X100MM (TROCAR) ×1 IMPLANT
TROCAR XCEL NON-BLD 5MMX100MML (ENDOMECHANICALS) ×1 IMPLANT

## 2023-08-27 NOTE — Transfer of Care (Signed)
 Immediate Anesthesia Transfer of Care Note  Patient: Casey Woods  Procedure(s) Performed: REPAIR, HERNIA, HIATAL, LAPAROSCOPIC FUNDOPLICATION, STOMACH, INCISIONLESS, ORAL APPROACH EGD (ESOPHAGOGASTRODUODENOSCOPY)  Patient Location: PACU  Anesthesia Type:General  Level of Consciousness: sedated  Airway & Oxygen Therapy: Patient Spontanous Breathing and Patient connected to face mask oxygen  Post-op Assessment: Report given to RN and Post -op Vital signs reviewed and stable  Post vital signs: Reviewed and stable  Last Vitals:  Vitals Value Taken Time  BP 133/72 08/27/23 1100  Temp    Pulse 75 08/27/23 1100  Resp 17 08/27/23 1100  SpO2 96 % 08/27/23 1100  Vitals shown include unfiled device data.  Last Pain:  Vitals:   08/27/23 0650  TempSrc:   PainSc: 0-No pain      Patients Stated Pain Goal: 5 (08/27/23 0640)  Complications: No notable events documented.

## 2023-08-27 NOTE — Op Note (Addendum)
 PATIENT: Casey Woods  PRE-OPERATIVE DIAGNOSIS:  GERD/ HIATAL HERNIA   POST-OPERATIVE DIAGNOSIS:  GERD/ SLIDING HIATAL HERNIA   PROCEDURE:  Procedure(s): LAPAROSCOPIC REPAIR OF HIATAL HERNIA - Elvan Hamel LAPAROSCOPIC LYSIS OF ADHESIONS -  Lenardo Westwood ESOPHAGOGASTRODUODENOSCOPY (EGD) TRANSORAL INCISIONLESS FUNDOPLICATION - Cirigliano LAPAROSCOPIC BILATERAL TAP BLOCK     SURGEON:  Surgeon(s): Aldean Hummingbird, MD  Harry Lindau MD   ASSISTANTS: Lockie Rima MD FACS   ANESTHESIA:   general   DRAINS: none    LOCAL MEDICATIONS USED:  MARCAINE    and OTHER exparel   SPECIMEN:  No Specimen   DISPOSITION OF SPECIMEN:  N/A   COUNTS:  YES   INDICATION FOR PROCEDURE: Patient has a longstanding history of GERD and an endoscopic evidence of a probable sliding hiatal hernia.  The patient was evaluated by gastroenterology for concomitant TIF procedure with hiatal hernia repair.  Please see outside records for additional information   PROCEDURE: Patient received oral Tylenol  and gabapentin preoperatively.  The patient also received additional antiemetic medications preoperatively.  The patient received 5000 units of subcutaneous heparin.  The patient was taken to the OR 1 at [facility] and placed upon on the operating room table.  General endotracheal anesthesia was established.  Sequential compression devices were placed.  The patient's arms were tucked at the side with the appropriate padding.  The patient received IV antibiotic prior to skin incision.  Surgical timeout was performed.   Access to the abdomen was obtained in the left upper quadrant at Palmer's point using Optiview technique.  A small incision was made then using a 0 degree 5 mm laparoscope through a 5 mm trocar I advanced it through all layers of the abdominal wall and carefully entered the abdominal cavity.  Pneumoperitoneum was smoothly established up to a patient pressure of 15 mmHg without any change in patient vital  signs.  There is no evidence of injury to surrounding viscera.  Interestingly the patient had adhesions of omentum to her anterior abdominal wall in the upper midline extending to the umbilicus as well as the left upper quadrant.  I ended up placing the left lateral abdominal 5 mm trocar and then used the harmonic scalpel to take down the omental adhesions from the anterior abdominal wall in the left upper quadrant this took about 20 minutes to do.  The patient was placed in steep reverse Trendelenburg.  A 5 mm trocar was placed slightly above into the left of the umbilicus.  A 12 mm trocar was placed in the right midabdomen followed by a 5 mm trocar in the lateral right abdomen.  A 5 mm trocar was placed in the subxiphoid position.  I bilateral Exparel/MARCAINE tap block was performed for postoperative pain relief.  The 5 mm subxiphoid trocar was removed and a Nathanson liver retractor was used to lift up the left lobe of the liver.  The stomach and diaphragm were inspected.  Patient had a sliding hiatal hernia.  The aperture opening at the diaphragm was probably about 3 cm.  The gastrohepatic ligament was incised with harmonic scalpel at the pars flaccida.  My assistant grasped the lesser curvature adipose tissue and reflected it to patient's left.  Identified the right crus of the diaphragm.  I gently incised it with harmonic scalpel.  Then using blunt dissection with a  Prestige grasper I was able to identify the left crus of the diaphragm.  We are able to achieve a circumferential mobilization of the distal esophagus at the  GE junction.  I took down some anterior attachments as well.  The fundus attachment to the left crus was also taken down with a Harmonic scalpel.  I did take down some of the short gastric vessels along the proximal greater curvature using the harmonic scalpel and then took down and divided the finger gastric ligament.  I was able to pass a Penrose from the right to the left behind the  esophagus.  Patient had a fairly lengthy paraesophageal perigastric lipoma which was carefully mobilized from the esophagus and GE junction using blunt dissection and harmonic scalpel.  The lipoma was removed and extracted from the abdomen.  The right vagus and left vagus nerves were identified and preserved.  I then continued circumferential mobilization of the esophagus within the mediastinum.  Avascular tissue was divided between the esophagus and the aorta.  I took down lateral attachments both on each side of the esophagus as well as anteriorly.  Again both vagus nerves were identified and preserved.  This point I felt we had achieved enough intra-abdominal esophageal length.  We had about 3 to 4 cm of intra-abdominal esophageal length..  It was probably about a 3 cm transverse hernia.  I closed the left and right crus with 2 interrupted 0 Ethibond sutures.  This provided a snug fit around the esophagus that left some room to pass a large 50fr lighted bougie.  Upper abdominal cavity was reinspected.  No evidence of bleeding.  The [facility] liver retractor was removed without injury to the liver.  The 12 mm trocar was removed and a 0 Vicryl in the PMI suture passer was used to close the fascial defect at that location.  Additional Exparel Marcaine mixture was infiltrated in this location.  Pneumoperitoneum was released.  Remaining trochars were removed.  Skin incisions were closed by me in a subcuticular fashion and followed by Dermabond.  All needle, instrument, and sponge counts were correct x2.  The patient remained intubated.   After laparoscopic hiatal hernia repair was complete, Dr. Harry Lindau, a gastroenterologist with endoscopic procedure expertise and trained in endoscopic fundoplication, proceeded to perform endoscopic fundoplication and he will dictate that separately.    I was present during the initial portion of when the endoscope was advanced by Dr. Karene Oto down the esophagus into the  stomach.  There is no evidence of injury to the esophagus.  There is no evidence of mucosal disruption.    PLAN OF CARE: Admit for overnight observation   PATIENT DISPOSITION:  PACU - hemodynamically stable.   Delay start of Pharmacological VTE agent (>24hrs) due to surgical blood loss or risk of bleeding:  no   Marianna Shirk. Elvan Hamel, MD, FACS General, Bariatric, & Minimally Invasive Surgery [facility], [place]

## 2023-08-27 NOTE — Interval H&P Note (Signed)
 History and Physical Interval Note:  08/27/2023 7:32 AM  Casey Woods  has presented today for surgery, with the diagnosis of HIATAL HERNIA.  The various methods of treatment have been discussed with the patient and family. After consideration of risks, benefits and other options for treatment, the patient has consented to  Procedure(s): REPAIR, HERNIA, HIATAL, LAPAROSCOPIC (N/A) FUNDOPLICATION, STOMACH, INCISIONLESS, ORAL APPROACH (N/A) EGD (ESOPHAGOGASTRODUODENOSCOPY) (N/A) as a surgical intervention.  The patient's history has been reviewed, patient examined, no change in status, stable for surgery.  I have reviewed the patient's chart and labs.  Questions were answered to the patient's satisfaction.     Laquetta Plank Savanah Bayles

## 2023-08-27 NOTE — Op Note (Signed)
 [facility] Patient Name: Casey Woods Procedure Date: 08/27/2023 MRN: [id number] Attending MD: Harry Lindau , MD, [id number] Date of Birth: 10-08-1945 CSN: [id number] Age: 78 Admit Type: Inpatient Procedure:                Upper GI endoscopy with Transoral Incisionless                            Fundoplication Indications:              Heartburn, For therapy of esophageal reflux, For                            therapy of hiatal hernia Providers:                Harry Lindau, DO, Bradley Caffey, Marvelyn Slim, Technician, Marianna Shirk. Elvan Hamel, MD (co-surgeon) Referring MD:              Medicines:                General Anesthesia Complications:            No immediate complications. Estimated Blood Loss:     Estimated blood loss was minimal. Procedure:                Pre-Anesthesia Assessment:                           - Prior to the procedure, a History and Physical                            was performed, and patient medications and                            allergies were reviewed. The patient's tolerance of                            previous anesthesia was also reviewed. The risks                            and benefits of the procedure and the sedation                            options and risks were discussed with the patient.                            All questions were answered, and informed consent                            was obtained. Prior Anticoagulants: The patient has                            taken no anticoagulant or antiplatelet agents. ASA  Grade Assessment: II - A patient with mild systemic                            disease. After reviewing the risks and benefits,                            the patient was deemed in satisfactory condition to                            undergo the procedure.                           After obtaining informed consent, the endoscope was                             passed under direct vision. Throughout the                            procedure, the patient's blood pressure, pulse, and                            oxygen saturations were monitored continuously. The                            GIF-H190 ([id number]) Olympus endoscope was introduced                            through the mouth, and advanced to the second part                            of duodenum. The upper GI endoscopy was                            accomplished without difficulty. The patient                            tolerated the procedure well. Scope In: Scope Out: Findings:      The examined esophagus was normal. The previously noted hiatal hernia       has since been surgically repaired by Dr. Elvan Hamel. In anticipation of       placing the larger caliber Esophyx device, the decision was made to       perform empiric esophageal dilation. A guidewire was placed and the       scope was withdrawn. Dilation was performed with a Savary dilator with       no resistance at 17 mm. The dilation site was examined following       endoscope reinsertion and showed no bleeding, mucosal tear or       perforation. Estimated blood loss: none.      The Z-line was regular and was found 41 cm from the incisors. The       decision was made to perform transoral fundoplication with the EsophyX       Z+ system. Before the procedure, the gastroesophageal flap valve was       classified as Hill Grade II (fold  present, opens with respiration). The       endoscope was withdrawn, placed through the plication device, reinserted       into the patient and advanced past the level of the GE junction at 41 cm       from the incisors and into the stomach. Next, the endoscope was advanced       beyond the device and retroflexed. The first plication site was       identified at the 11 o'clock position. With the device in the proper       position, the helical retractor was deployed and tissue was pulled into        the mold before it was closed. The device was rotated, suction was       applied using the invaginator, then the device was advanced slightly and       two H-shaped fasteners were placed. The device was reloaded and the       process repeated in order to deploy a total of six fasteners at the       first site. The device was then rotated to the 1 o'clock position after       which the helical retractor was used to grasp additional tissue within       the mold before rotation and deployment of a total of six fasteners at       the second site. To complete reconstruction of the valve, additional       fasteners were deployed at the following sites: four fasteners at 5       o'clock and four fasteners at 7 o'clock positions. In total, 20       fasteners contributed to create a valve measuring 3 cm in length which       involved 270 degrees of the circumference upon retroflexed view.       Following the procedure, the flap valve was reclassified as Hill Grade I       (prominent fold, tight to endoscope). The EsophyX device and endoscope       were then removed. Relook endoscopy was performed prior to the       conclusion of the case to confirm the above findings. Estimated blood       loss was minimal.      The entire examined stomach was normal.      The examined duodenum was normal. Impression:               - Normal esophagus. Dilated with 17 French Savary                            dilator.                           - Z-line regular, 41 cm from the incisors.                           - Normal stomach.                           - Normal examined duodenum.                           - EsophyX transoral fundoplication was performed.                           -  No specimens collected. Moderate Sedation:      Not Applicable - Patient had care per Anesthesia. Recommendation:           -Admit to surgical ward for overnight observation                            with anticipated discharge  tomorrow                           -Zofran 4 mg IV every 6 hours x24 hours, then prn                           -Reglan 10 mg every 6 hours x24 hours, then prn                           -Resume scopolamine patch x3 days (applied preop)                           -Pepcid 20 mg IV twice daily while in house, then                            discharged with Prilosec 20 mg twice daily x 2                            weeks, then 20 mg daily x 2 weeks, then on-demand                            only. Patient has been previously intolerant to                            Protonix, Dexilant, and Voquenza.                           -Decadron 8 mg every 6 hours times max 5 doses                           -Viscous lidocaine as needed for odynophagia,                            esophageal pain.                           -Gas-X (simethicone) 80 mg p.o. prn every 6 hours                            gas pain, abdominal discomfort                           -Tylenol  3 (APAP 120 mg/codeine 12 mg per 5 mL): 15                            mL's every 4 hours prn pain                           -  Colace 100 mg p.o. twice daily if taking pain                            medications                           -Clear liquid diet ok overnight                           -Okay to ambulate with assist around the ward                           -Please do not hesitate to contact me directly with                            any postoperative questions or concerns Procedure Code(s):        --- Professional ---                           [phone], Esophagogastroduodenoscopy, flexible,                            transoral; with insertion of guide wire followed by                            passage of dilator(s) through esophagus over guide                            wire Diagnosis Code(s):        --- Professional ---                           R12, Heartburn                           K21.9, Gastro-esophageal reflux disease without                             esophagitis                           K44.9, Diaphragmatic hernia without obstruction or                            gangrene CPT copyright 2022 [employer]. All rights reserved. The codes documented in this report are preliminary and upon coder review may  be revised to meet current compliance requirements. Harry Lindau, MD 08/27/2023 11:20:15 AM Fran Imus MD, MD Number of Addenda: 0

## 2023-08-27 NOTE — Anesthesia Procedure Notes (Signed)
 Procedure Name: Intubation Date/Time: 08/27/2023 7:47 AM  Performed by: Micky Albee, CRNAPre-anesthesia Checklist: Patient identified, Emergency Drugs available, Suction available, Patient being monitored and Timeout performed Patient Re-evaluated:Patient Re-evaluated prior to induction Oxygen Delivery Method: Circle system utilized Preoxygenation: Pre-oxygenation with 100% oxygen Induction Type: IV induction Ventilation: Mask ventilation without difficulty Laryngoscope Size: Mac and 4 Grade View: Grade II Tube type: Oral Tube size: 7.0 mm Number of attempts: 2 Airway Equipment and Method: Stylet Placement Confirmation: ETT inserted through vocal cords under direct vision, positive ETCO2 and breath sounds checked- equal and bilateral Secured at: 21 cm Tube secured with: Tape Dental Injury: Teeth and Oropharynx as per pre-operative assessment

## 2023-08-27 NOTE — Progress Notes (Signed)
 Followed up with Casey Woods.  She reports quite a bit of pain in her upper abdomen and chest with a sore throat as well, 10/10, but pain medication and the viscous lidocaine do help some.  No nausea or vomiting at this point.

## 2023-08-27 NOTE — Plan of Care (Signed)

## 2023-08-27 NOTE — Anesthesia Postprocedure Evaluation (Signed)
 Anesthesia Post Note  Patient: Casey Woods  Procedure(s) Performed: REPAIR, HERNIA, HIATAL, LAPAROSCOPIC FUNDOPLICATION, STOMACH, INCISIONLESS, ORAL APPROACH EGD (ESOPHAGOGASTRODUODENOSCOPY)     Patient location during evaluation: PACU Anesthesia Type: General Level of consciousness: awake and alert Pain management: pain level controlled Vital Signs Assessment: post-procedure vital signs reviewed and stable Respiratory status: spontaneous breathing, nonlabored ventilation, respiratory function stable and patient connected to nasal cannula oxygen Cardiovascular status: blood pressure returned to baseline and stable Postop Assessment: no apparent nausea or vomiting Anesthetic complications: no   No notable events documented.  Last Vitals:  Vitals:   08/27/23 1354 08/27/23 1601  BP: (!) 140/71 (!) 160/77  Pulse: 70 64  Resp: 17 18  Temp: 36.4 C 36.5 C  SpO2: 98% 97%    Last Pain:  Vitals:   08/27/23 1534  TempSrc:   PainSc: 10-Worst pain ever                 Theotis Flake P Coury Grieger

## 2023-08-27 NOTE — H&P (Signed)
 CC: here for surgery  Requesting provider: Dr Karene Oto   HPI: Casey Woods is an 78 y.o. female who is here for concomitant lap hiatal hernia repair with TIF procedure by dr Karene Oto. She denies any changes since seen in clinic.   Old hpi: History of Present Illness Casey Woods is a 78 year old female with a hiatal hernia who presents for consultation regarding hiatal hernia repair.  She has a history of reflux and heartburn, managed with medication. Without medication, she experiences regurgitation of both solid and liquid foods approximately 30 minutes after ingestion, leading to partial vomiting. She describes this as an annoyance rather than heartburn. No weight loss, odynophagia, or dysphagia. She reports a 'waterfall' sound in her stomach in the mornings and a sensation of phlegm in her throat upon waking. She experiences belching, particularly when regurgitation is imminent, and avoids citric juices and sodas as they exacerbate her symptoms.  She has been taking omeprazole, which controls her symptoms during the day but wears off by 7 PM, leading to some regurgitation. She has a history of allergic reactions, including rashes, to other proton pump inhibitors (PPIs).  She has a rash currently on her upper and lower torso, elbows, and abdomen, which she manages with ointment. The rash is associated with PPI use and resolves upon discontinuation of the medication.  No heart problems, but she is on pravastatin  for coronary artery calcification. She does not experience shortness of breath during activities such as walking.  GERD history: -Index symptoms: Heartburn, regurgitation, belching. No dysphagia -Exacerbating features: Acidic juices, soda -Medications trialed: Esomeprazole (rash), pantoprazole (rash), famotidine (hair loss), Dexlansoprazole (eye twitching, rash), Voquenza (cost prohibitive), Carafate (nausea/vomiting), Tums -Current medications: Omeprazole 20 mg  daily -Complications: Hiatal hernia  GERD evaluation: -Last EGD: 04/2018, 04/02/23 - see below -Barium esophagram: 01/2022: Small to medium hiatal hernia with nonobstructing distal esophageal ring, normal motility without mass or stricture. Moderate reflux to the level of the aortic arch with water siphon test, spontaneous reflux noted as well. 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   Elevated cholesterol    Family history of adverse reaction to anesthesia    Sick nausea, vomiting   GERD (gastroesophageal reflux disease)    Heart murmur    History of hiatal hernia    Migraine headache    Venous insufficiency (chronic) (peripheral)     Past Surgical History:  Procedure Laterality Date   ABDOMINAL HYSTERECTOMY     BUNIONECTOMY Bilateral    COLONOSCOPY     LASER ABLATION     right - varicose veins   LEG SURGERY     UPPER GI ENDOSCOPY      Family History  Problem Relation Age of Onset   Other Mother        PAD w/ hx left BKA    Heart attack Father        died age 59   Heart disease Brother        died age 59- CHF   Liver disease Neg Hx    Colon cancer Neg Hx    Esophageal cancer Neg Hx     Social:  reports that she has quit smoking. Her smoking use included cigarettes. She has been exposed to tobacco smoke. She has never used smokeless tobacco. She reports  current alcohol use. She reports that she does not use drugs.  Allergies:  Allergies  Allergen Reactions   Beef-Derived Drug Products     headache   Rosuvastatin  Calcium  Other (See Comments)    Lethargy, joint pain, aching   Dexlansoprazole Rash   Esomeprazole Rash   Voquezna Dual Pak [Amoxicillin & Vonoprazan] Rash    Medications: I have reviewed the patient's current medications.   ROS - all of the below systems have been  reviewed with the patient and positives are indicated with bold text General: chills, fever or night sweats Eyes: blurry vision or double vision ENT: epistaxis or sore throat Allergy/Immunology: itchy/watery eyes or nasal congestion Hematologic/Lymphatic: bleeding problems, blood clots or swollen lymph nodes Endocrine: temperature intolerance or unexpected weight changes Breast: new or changing breast lumps or nipple discharge Resp: cough, shortness of breath, or wheezing CV: chest pain or dyspnea on exertion GI: as per HPI GU: dysuria, trouble voiding, or hematuria MSK: joint pain or joint stiffness Neuro: TIA or stroke symptoms Derm: pruritus and skin lesion changes Psych: anxiety and depression  PE Blood pressure (!) 166/67, pulse (!) 57, temperature 98.2 F (36.8 C), temperature source Oral, resp. rate 16, height 5\' 9"  (1.753 m), weight 84.6 kg, SpO2 97%. Constitutional: NAD; conversant; no deformities Eyes: Moist conjunctiva; no lid lag; anicteric; PERRL Neck: Trachea midline; no thyromegaly Lungs: Normal respiratory effort; no tactile fremitus CV: RRR; no palpable thrills; no pitting edema GI: Abd soft, nt, nd; no palpable hepatosplenomegaly MSK: Normal gait; no clubbing/cyanosis Psychiatric: Appropriate affect; alert and oriented x3 Lymphatic: No palpable cervical or axillary lymphadenopathy Skin:no rash  No results found for this or any previous visit (from the past 48 hours).  No results found.  Imaging: reviewed  A/P: Casey Woods is an 78 y.o. female with  Hiatal hernia with gerd  To OR for concomitant Lap HHR with TIF Rediscussed typical course and aftercare All questions asked and answered ERAS Iv abx Preop subcu heparin  Casey Woods. Casey Hamel, MD, FACS General, Bariatric, & Minimally Invasive Surgery [facility]

## 2023-08-27 NOTE — H&P (Signed)
 Chief Complaint: GERD, Hiatal hernia    HPI:     Casey Woods is a 78 y.o. female presenting today for concomitant laparoscopic hiatal hernia repair and Transoral Incisionless Fundoplication (cTIF) for control of reflux and to stop or significantly reduce the need for acid suppression therapy.  She has had reflux symptoms for the last 3 years or so, mainly regurgitation, belching.  Rare heartburn.  Symptoms tend to be postprandial.  Has trialed multiple acid suppression medications which have ultimately been efficacious, but has been complicated by various allergies and intolerances, namely rash to each of the different PPIs that she has tried.  Used Voquenza which worked, but was cost prohibitive.  Rash tends to occur within 2 weeks or so of starting PPI, then resolves after stopping PPI.  Most recently stopped all PPIs for about a month, but with ongoing reflux symptoms requiring regular use of Tums, so she restarted omeprazole 20 mg daily, again with good efficacy, but rash has started again on her upper extremities.   GERD history: -Index symptoms: Heartburn, regurgitation, belching.  No dysphagia -Exacerbating features: Acidic juices, soda -Medications trialed: Esomeprazole (rash), pantoprazole (rash), famotidine (hair loss), Dexlansoprazole (eye twitching, rash), Voquenza (cost prohibitive), Carafate (nausea/vomiting), Tums -Current medications: Omeprazole 20 mg daily -Complications: Hiatal hernia   GERD evaluation: -Last EGD: 03/2023 -Barium esophagram: 01/2022: Small to medium hiatal hernia with nonobstructing distal esophageal ring, normal motility without mass or stricture.  Moderate reflux to the level of the aortic arch with water siphon test, spontaneous reflux noted as well.  13 mm tablet passed without delay -Esophageal Manometry: None -pH/Impedance: None -Bravo: None -GES: 09/2022: Normal   Endoscopic History: - 08/31/2013: Colonoscopy: Hyperplastic polyp,  sigmoid diverticulosis, otherwise normal.  Scheduled for repeat in 08/2023 with Dr. Kimble Pennant - 05/19/2018: EGD: Small hiatal hernia, Schatzki's ring, dilated.  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   Elevated cholesterol    Family history of adverse reaction to anesthesia    Sick nausea, vomiting   GERD (gastroesophageal reflux disease)    Heart murmur    History of hiatal hernia    Migraine headache    Venous insufficiency (chronic) (peripheral)      Past Surgical History:  Procedure Laterality Date   ABDOMINAL HYSTERECTOMY     BUNIONECTOMY Bilateral    COLONOSCOPY     LASER ABLATION     right - varicose veins   LEG SURGERY     UPPER GI ENDOSCOPY     Family History  Problem Relation Age of Onset   Other Mother        PAD w/ hx left BKA    Heart attack Father        died age 76   Heart disease Brother        died age 19- CHF   Liver disease Neg Hx    Colon cancer Neg Hx    Esophageal cancer Neg Hx    Social History   Tobacco Use   Smoking status: Former    Types: Cigarettes    Passive exposure: Past   Smokeless tobacco: Never  Vaping Use   Vaping status: Never Used  Substance Use Topics   Alcohol use: Yes    Comment: SOCIAL  Drug use: No   Current Facility-Administered Medications  Medication Dose Route Frequency Provider Last Rate Last Admin   Chlorhexidine Gluconate Cloth 2 % PADS 6 each  6 each Topical Once Aldean Hummingbird, MD       dexamethasone (DECADRON) injection 4 mg  4 mg Intravenous On Call to OR Aldean Hummingbird, MD       lactated ringers infusion   Intravenous Continuous Lethaniel Rave, MD 10 mL/hr at 08/27/23 8657 Continued from Pre-op at 08/27/23 0713   vancomycin (VANCOCIN) IVPB 1000 mg/200 mL premix  1,000 mg Intravenous On Call to OR Aldean Hummingbird, MD 200 mL/hr at 08/27/23 0652  1,000 mg at 08/27/23 8469   Allergies  Allergen Reactions   Beef-Derived Drug Products     headache   Rosuvastatin  Calcium  Other (See Comments)    Lethargy, joint pain, aching   Dexlansoprazole Rash   Esomeprazole Rash   Voquezna Dual Pak [Amoxicillin & Vonoprazan] Rash     Review of Systems: All systems reviewed and negative except where noted in HPI.     Physical Exam:    Wt Readings from Last 3 Encounters:  08/27/23 84.6 kg  08/23/23 84.6 kg  04/12/23 83.9 kg    BP (!) 166/67   Pulse (!) 57   Temp 98.2 F (36.8 C) (Oral)   Resp 16   Ht 5\' 9"  (1.753 m)   Wt 84.6 kg   SpO2 97%   BMI 27.56 kg/m  Constitutional:  Pleasant, in no acute distress. Psychiatric: Normal mood and affect. Behavior is normal. Cardiovascular: Normal rate, regular rhythm. No edema Pulmonary/chest: Effort normal and breath sounds normal. No wheezing, rales or rhonchi. Abdominal: Soft, nondistended, nontender. Bowel sounds active throughout. There are no masses palpable. No hepatomegaly. Neurological: Alert and oriented to person place and time. Skin: Skin is warm and dry. No rashes noted.   ASSESSMENT AND PLAN;   1) GERD 2) Hiatal hernia 78 year old female with history of bothersome reflux complicated by hiatal hernia with clear objective evidence of reflux on previous barium esophagram, presents today for concomitant laparoscopic hiatal hernia repair and Transoral Incisionless Fundoplication (cTIF). - Plan to proceed with cTIF in OR - Tentative plan for overnight admission for observation following surgery   Stelios Kirby V Railey Glad, DO, FACG  08/27/2023, 7:27 AM   No ref. provider found

## 2023-08-28 ENCOUNTER — Other Ambulatory Visit (HOSPITAL_COMMUNITY): Payer: Self-pay

## 2023-08-28 DIAGNOSIS — K219 Gastro-esophageal reflux disease without esophagitis: Secondary | ICD-10-CM

## 2023-08-28 DIAGNOSIS — K449 Diaphragmatic hernia without obstruction or gangrene: Secondary | ICD-10-CM | POA: Diagnosis not present

## 2023-08-28 LAB — CBC
HCT: 37.3 % (ref 36.0–46.0)
Hemoglobin: 12.2 g/dL (ref 12.0–15.0)
MCH: 29.1 pg (ref 26.0–34.0)
MCHC: 32.7 g/dL (ref 30.0–36.0)
MCV: 89 fL (ref 80.0–100.0)
Platelets: 220 10*3/uL (ref 150–400)
RBC: 4.19 MIL/uL (ref 3.87–5.11)
RDW: 13.4 % (ref 11.5–15.5)
WBC: 10.9 10*3/uL — ABNORMAL HIGH (ref 4.0–10.5)
nRBC: 0 % (ref 0.0–0.2)

## 2023-08-28 LAB — BASIC METABOLIC PANEL WITH GFR
Anion gap: 10 (ref 5–15)
BUN: 12 mg/dL (ref 8–23)
CO2: 23 mmol/L (ref 22–32)
Calcium: 9 mg/dL (ref 8.9–10.3)
Chloride: 103 mmol/L (ref 98–111)
Creatinine, Ser: 0.74 mg/dL (ref 0.44–1.00)
GFR, Estimated: >60 mL/min (ref >60)
Glucose, Bld: 154 mg/dL — ABNORMAL HIGH (ref 70–99)
Potassium: 4.4 mmol/L (ref 3.5–5.1)
Sodium: 136 mmol/L (ref 135–145)

## 2023-08-28 LAB — MAGNESIUM: Magnesium: 2.2 mg/dL (ref 1.7–2.4)

## 2023-08-28 MED ORDER — LIDOCAINE VISCOUS HCL 2 % MT SOLN
5.0000 mL | Freq: Three times a day (TID) | OROMUCOSAL | 0 refills | Status: AC | PRN
  Filled 2023-08-28: qty 100, 7d supply, fill #0

## 2023-08-28 MED ORDER — ONDANSETRON HCL 4 MG PO TABS
4.0000 mg | ORAL_TABLET | Freq: Every day | ORAL | 1 refills | Status: AC | PRN
  Filled 2023-08-28: qty 30, 30d supply, fill #0

## 2023-08-28 MED ORDER — TRAMADOL HCL 50 MG PO TABS
50.0000 mg | ORAL_TABLET | Freq: Four times a day (QID) | ORAL | 0 refills | Status: AC | PRN
  Filled 2023-08-28: qty 15, 4d supply, fill #0

## 2023-08-28 MED ORDER — SIMETHICONE 80 MG PO CHEW
80.0000 mg | CHEWABLE_TABLET | Freq: Four times a day (QID) | ORAL | 0 refills | Status: AC | PRN
  Filled 2023-08-28: qty 30, 8d supply, fill #0

## 2023-08-28 MED ORDER — PROMETHAZINE HCL 12.5 MG PO TABS
12.5000 mg | ORAL_TABLET | Freq: Three times a day (TID) | ORAL | 0 refills | Status: AC | PRN
  Filled 2023-08-28: qty 10, 4d supply, fill #0

## 2023-08-28 MED ORDER — ACETAMINOPHEN 500 MG PO TABS: 1000.0000 mg | ORAL_TABLET | Freq: Three times a day (TID) | ORAL | Status: AC

## 2023-08-28 MED ORDER — FAMOTIDINE 20 MG PO TABS
20.0000 mg | ORAL_TABLET | Freq: Two times a day (BID) | ORAL | 0 refills | Status: DC
  Filled 2023-08-28: qty 60, 30d supply, fill #0

## 2023-08-28 MED ORDER — BOOST / RESOURCE BREEZE PO LIQD CUSTOM
1.0000 | Freq: Three times a day (TID) | ORAL | Status: DC
  Administered 2023-08-28 (×2): 1 via ORAL

## 2023-08-28 NOTE — Plan of Care (Signed)
  Problem: Activity: Goal: Risk for activity intolerance will decrease Outcome: Progressing   Problem: Nutrition: Goal: Adequate nutrition will be maintained Outcome: Progressing   Problem: Coping: Goal: Level of anxiety will decrease Outcome: Progressing   Problem: Elimination: Goal: Will not experience complications related to urinary retention Outcome: Completed/Met

## 2023-08-28 NOTE — Progress Notes (Signed)
 HISTORY OF PRESENT ILLNESS:  Casey Woods is a 78 y.o. female who underwent hiatal hernia repair and TIF yesterday.  Was having pain postoperatively yesterday.  Seen by surgery earlier today.  Feels well.  No significant pain.  Sitting in a chair having a bite eat.  LABORATORIES: WBC 10.9 Hemoglobin 12.2 Normal electrolytes with glucose 154  REVIEW OF SYSTEMS:  All non-GI ROS negative. Past Medical History:  Diagnosis Date   Coronary artery abnormality    Edema leg    left > right   Elevated cholesterol    Family history of adverse reaction to anesthesia    Sick nausea, vomiting   GERD (gastroesophageal reflux disease)    Heart murmur    History of hiatal hernia    Migraine headache    Venous insufficiency (chronic) (peripheral)     Past Surgical History:  Procedure Laterality Date   ABDOMINAL HYSTERECTOMY     BUNIONECTOMY Bilateral    COLONOSCOPY     LASER ABLATION     right - varicose veins   LEG SURGERY     UPPER GI ENDOSCOPY      Social History Casey Woods  reports that she has quit smoking. Her smoking use included cigarettes. She has been exposed to tobacco smoke. She has never used smokeless tobacco. She reports current alcohol use. She reports that she does not use drugs.  family history includes Heart attack in her father; Heart disease in her brother; Other in her mother.  Allergies  Allergen Reactions   Beef-Derived Drug Products     headache   Rosuvastatin  Calcium  Other (See Comments)    Lethargy, joint pain, aching   Dexlansoprazole Rash   Esomeprazole Rash   Voquezna Dual Pak [Amoxicillin & Vonoprazan] Rash       PHYSICAL EXAMINATION: Vital signs: BP 135/61 (BP Location: Right Arm)   Pulse 63   Temp 98.4 F (36.9 C) (Oral)   Resp 16   Ht 5\' 9"  (1.753 m)   Wt 84.6 kg   SpO2 94%   BMI 27.56 kg/m  General: Well-developed, well-nourished, no acute distress HEENT: Sclerae are anicteric Abdomen: Not examined. Extremities: No obvious  abnormalities Psychiatric: alert and oriented x3. Cooperative   ASSESSMENT:  1.  Postop day 1--hiatal hernia repair and TIF.  Doing well.  No significant pain.  Tolerating diet   PLAN:  1.  Anticipating discharge 2.  Follow-up with Dr. Karene Oto as planned

## 2023-08-28 NOTE — Plan of Care (Signed)
  Problem: Education: Goal: Knowledge of General Education information will improve Description: Including pain rating scale, medication(s)/side effects and non-pharmacologic comfort measures Outcome: Adequate for Discharge   Problem: Health Behavior/Discharge Planning: Goal: Ability to manage health-related needs will improve Outcome: Adequate for Discharge   Problem: Clinical Measurements: Goal: Ability to maintain clinical measurements within normal limits will improve Outcome: Adequate for Discharge Goal: Will remain free from infection Outcome: Adequate for Discharge Goal: Diagnostic test results will improve Outcome: Adequate for Discharge Goal: Respiratory complications will improve Outcome: Adequate for Discharge Goal: Cardiovascular complication will be avoided Outcome: Adequate for Discharge   Problem: Activity: Goal: Risk for activity intolerance will decrease Outcome: Adequate for Discharge   Problem: Nutrition: Goal: Adequate nutrition will be maintained Outcome: Adequate for Discharge   Problem: Coping: Goal: Level of anxiety will decrease Outcome: Adequate for Discharge   Problem: Elimination: Goal: Will not experience complications related to bowel motility Outcome: Adequate for Discharge   Problem: Pain Managment: Goal: General experience of comfort will improve and/or be controlled Outcome: Adequate for Discharge   Problem: Safety: Goal: Ability to remain free from injury will improve Outcome: Adequate for Discharge   Problem: Skin Integrity: Goal: Risk for impaired skin integrity will decrease Outcome: Adequate for Discharge

## 2023-08-28 NOTE — Care Management Obs Status (Signed)
 MEDICARE OBSERVATION STATUS NOTIFICATION   Patient Details  Name: Casey Woods MRN: [id number] Date of Birth: 11/14/1945   Medicare Observation Status Notification Given:  Yes    Levie Ream, RN 08/28/2023, 11:40 AM

## 2023-08-28 NOTE — Progress Notes (Signed)
 AVS reviewed w/ pt who verbalized an understanding. No other questions. PIV removed as noted. TOC meds in a secure bag delivered to pt in room by this  RN- pt dressing for d/c to home.

## 2023-08-28 NOTE — Discharge Instructions (Signed)
 Follow the charge instruct given to you by Dr. Karene Oto preoperatively  Please follow discharge instructions from Dr [facility].  Please call their office with questions regarding diet, medications, trouble with oral intake  [phone]  [facility] LAP[facility]OS[facility] SURGERY: POST OP INSTRUCTIONS Always review your discharge instruction sheet given to you by the facility where your surgery was performed.   PAIN CONTROL  First take acetaminophen  (Tylenol ) to control your pain after surgery.  Follow directions on package.  Taking acetaminophen  (Tylenol )  regularly after surgery will help to control your pain and lower the amount of prescription pain medication you may need.  You should not take more than 3,000 mg (3 grams) of acetaminophen  (Tylenol ) in 24 hours.  You should not take ibuprofen (Advil), aleve, motrin, naprosyn or other NSAIDS if you have a history of stomach ulcers or chronic kidney disease.  A prescription for pain medication may be given to you upon discharge.  Take your pain medication as prescribed, if you still have uncontrolled pain after taking acetaminophen  (Tylenol ) or ibuprofen (Advil). Use ice packs to help control pain. If you need a refill on your pain medication, please contact your pharmacy.  They will contact our office to request authorization. Prescriptions will not be filled after 5pm or on week-ends.  HOME MEDICATIONS Take your usually prescribed medications unless otherwise directed.  DIET Follow diet instructions given to you by GI medicine/Dr Cirigliano   CONSTIPATION It is common to experience some constipation after surgery and if you are taking pain medication.  Increasing fluid intake and taking a stool softener (such as Colace) will usually help or prevent this problem from occurring.  A mild laxative (Milk of Magnesia or Miralax) should be taken according to package instructions if there are no bowel movements after 48  hours.  WOUND/INCISION CARE Most patients will experience some swelling and bruising in the area of the incisions.  Ice packs will help.  Swelling and bruising can take several days to resolve.  Unless discharge instructions indicate otherwise, follow guidelines below  STERI-STRIPS - you may remove your outer bandages 48 hours after surgery, and you may shower at that time.  You have steri-strips (small skin tapes) in place directly over the incision.  These strips should be left on the skin for 7-10 days.   DERMABOND/SKIN GLUE - you may shower in 24 hours.  The glue will flake off over the next 2-3 weeks. Any sutures or staples will be removed at the office during your follow-up visit.  ACTIVITIES You may resume regular (light) daily activities beginning the next day--such as daily self-care, walking, climbing stairs--gradually increasing activities as tolerated.  You may have sexual intercourse when it is comfortable.  Refrain from any heavy lifting or straining until approved by your doctor. You may drive when you are no longer taking prescription pain medication, you can comfortably wear a seatbelt, and you can safely maneuver your car and apply brakes.  FOLLOW-UP You should see your doctor in the office for a follow-up appointment approximately 2-3 weeks after your surgery.  You should have been given your post-op/follow-up appointment when your surgery was scheduled.  If you did not receive a post-op/follow-up appointment, make sure that you call for this appointment within a day or two after you arrive home to insure a convenient appointment time.  OTHER INSTRUCTIONS   WHEN TO CALL YOUR DOCTOR (You can also call [facility] if you are having trouble reaching [facility]): Fever over  101.0 Inability to urinate Continued bleeding from incision. Increased pain, redness, or drainage from the incision. Increasing abdominal pain Trouble with oral intake  The clinic staff is available to answer  your questions during regular business hours.  Please don't hesitate to call and ask to speak to one of the nurses for clinical concerns.  If you have a medical emergency, go to the nearest emergency room or call 911.  A surgeon from [facility] is always on call at the hospital. [address], [address], [place], [place]  [address] ? [address] [address], [place], [place]   [address] [phone] ? [phone] ? FAX [phone] Web site: [email or web address]  EATING AFTER YOUR ESOPHAGEAL SURGERY (Stomach Fundoplication, Hiatal Hernia repair, Achalasia surgery, etc)  ######################################################################  EAT Start with a full liquid diet (see below) Gradually transition to a high fiber diet with a fiber supplement over the next month after discharge.    WALK Walk an hour a day.  Control your pain to do that.    CONTROL PAIN Control pain so that you can walk, sleep, tolerate sneezing/coughing, go up/down stairs.  HAVE A BOWEL MOVEMENT DAILY Keep your bowels regular to avoid problems.  OK to try a laxative to override constipation.  OK to use an antidairrheal to slow down diarrhea.  Call if not better after 2 tries  CALL IF YOU HAVE PROBLEMS/CONCERNS Call if you are still struggling despite following these instructions. Call if you have concerns not answered by these instructions  ######################################################################   After your esophageal surgery, expect some sticking with swallowing over the next 1-2 months.    If food sticks when you eat, it is called "dysphagia".  This is due to swelling around your esophagus at the wrap & hiatal diaphragm repair.  It will gradually ease off over the next few months.  To help you through this temporary phase, we start you out on a full liquid diet.  Your first meal in the hospital was thin liquids.  You should have been given a full liquid diet by the time  you left the hospital. Stay on clears and full liquids for the first week. Some patients may need to stay on a liquid diet for up to 2 weeks if having trouble swallowing.  Once tolerating that well, you can advance to pureed diet.   We ask patients to stay on a pureed diet for the 2nd-3rd week to avoid anything getting "stuck" near your recent surgery.  Don't be alarmed if your ability to swallow doesn't progress according to this plan.  Everyone is different and some diets can advance more or less quickly.    It is often helpful to crush your medications or split them as they can sometimes stick, especially the first week or so.   Some BASIC RULES to follow are: Maintain an upright position whenever eating or drinking. Take small bites - just a teaspoon size bite at a time. Eat slowly.  It may also help to eat only one food at a time. Consider nibbling through smaller, more frequent meals & avoid the urge to eat BIG meals Do not push through feelings of fullness, nausea, or bloatedness Do not mix solid foods and liquids in the same mouthful Try not to "wash foods down" with large gulps of liquids. Avoid carbonated (bubbly/fizzy) drinks.   Avoid foods that make you feel gassy or bloated.  Start with bland foods first.  Wait on trying greasy, fried, or spicy meals until  you are tolerating more bland solids well. Understand that it will be hard to burp and belch at first.  This gradually improves with time.  Expect to be more gassy/flatulent/bloated initially.  Walking will help your body manage it better. Consider using medications for bloating that contain simethicone  such as  Maalox or Gas-X  Consider crushing her medications, especially smaller pills.  The ability to swallow pills should get easier after a few weeks Eat in a relaxed atmosphere & minimize distractions. Avoid talking while eating.   Do not use straws. Following each meal, sit in an upright position (90 degree angle) for 60 to 90  minutes.  Going for a short walk can help as well If food does stick, don't panic.  Try to relax and let the food pass on its own.  Sipping WARM LIQUID such as strong hot black tea can also help slide it down.   Be gradual in changes & use common sense:  -If you easily tolerating a certain "level" of foods, advance to the next level gradually -If you are having trouble swallowing a particular food, then avoid it.   -If food is sticking when you advance your diet, go back to thinner previous diet (the lower LEVEL) for 1-2 days.  LEVEL 2 = PUREED DIET  Start 1- 2 WEEKS AFTER SURGERY IF YOU ARE TOLERATING A FULL LIQUID DIET EASILY  -Foods in this group are pureed or blenderized to a smooth, mashed potato-like consistency.  -If necessary, the pureed foods can keep their shape with the addition of a thickening agent.   -Meat should be pureed to a smooth, pasty consistency.  Hot broth or gravy may be added to the pureed meat, approximately 1 oz. of liquid per 3 oz. serving of meat. -CAUTION:  If any foods do not puree into a smooth consistency, swallowing will be more difficult.  (For example, nuts or seeds sometimes do not blend well.)  Hot Foods Cold Foods  Pureed scrambled eggs and cheese Pureed cottage cheese  Baby cereals Thickened juices and nectars  Thinned cooked cereals (no lumps) Thickened milk or eggnog  Pureed [place] toast or pancakes Ensure  Mashed potatoes Ice cream  Pureed parsley, au gratin, scalloped potatoes, candied sweet potatoes Fruit or [place] ice, sherbet  Pureed buttered or alfredo noodles Plain yogurt  Pureed vegetables (no corn or peas) Instant breakfast  Pureed soups and creamed soups Smooth pudding, mousse, custard  Pureed scalloped apples Whipped gelatin  Gravies Sugar, syrup, honey, jelly  Sauces, cheese, tomato, barbecue, white, creamed Cream  Any baby food Creamer  Alcohol in moderation (not beer or champagne) Margarine  Coffee or tea Mayonnaise    Ketchup, mustard   Apple sauce   SAMPLE MENU:  PUREED DIET Breakfast Lunch Dinner  Orange juice, 1/2 cup Cream of wheat, 1/2 cup Pineapple juice, 1/2 cup Pureed [place], barley soup, 3/4 cup Pureed Hawaiian chicken, 3 oz  Scrambled eggs, mashed or blended with cheese, 1/2 cup Tea or coffee, 1 cup  Whole milk, 1 cup  Non-dairy creamer, 2 Tbsp. Mashed potatoes, 1/2 cup Pureed cooled broccoli, 1/2 cup Apple sauce, 1/2 cup Coffee or tea Mashed potatoes, 1/2 cup Pureed spinach, 1/2 cup Frozen yogurt, 1/2 cup Tea or coffee      LEVEL 3 = SOFT DIET  After your first 4 weeks, you can advance to a soft diet.   Keep on this diet until everything goes down easily.  Hot Foods Cold Foods  White fish Cottage cheese  Stuffed fish Junior baby fruit  Baby food meals Semi thickened juices  Minced soft cooked, scrambled, poached eggs nectars  Souffle & omelets Ripe mashed bananas  Cooked cereals Canned fruit, pineapple sauce, milk  potatoes Milkshake  Buttered or Alfredo noodles Custard  Cooked cooled vegetable Puddings, including tapioca  Sherbet Yogurt  Vegetable soup or alphabet soup Fruit ice, [place] ice  Gravies Whipped gelatin  Sugar, syrup, honey, jelly Junior baby desserts  Sauces:  Cheese, creamed, barbecue, tomato, white Cream  Coffee or tea Margarine   SAMPLE MENU:  LEVEL 3 Breakfast Lunch Dinner  Orange juice, 1/2 cup Oatmeal, 1/2 cup Scrambled eggs with cheese, 1/2 cup Decaffeinated tea, 1 cup Whole milk, 1 cup Non-dairy creamer, 2 Tbsp Pineapple juice, 1/2 cup Minced beef, 3 oz Gravy, 2 Tbsp Mashed potatoes, 1/2 cup Minced fresh broccoli, 1/2 cup Applesauce, 1/2 cup Coffee, 1 cup [place], barley soup, 3/4 cup Minced Hawaiian chicken, 3 oz Mashed potatoes, 1/2 cup Cooked spinach, 1/2 cup Frozen yogurt, 1/2 cup Non-dairy creamer, 2 Tbsp      LEVEL 4 = CHOPPED DIET  -After all the foods in level 3 (soft diet) are passing through well you should advance up  to more chopped foods.  -It is still important to cut these foods into small pieces and eat slowly.  Hot Foods Cold Foods  Poultry Cottage cheese  Chopped Swedish meatballs Yogurt  Meat salads (ground or flaked meat) Milk  Flaked fish (tuna) Milkshakes  Poached or scrambled eggs Soft, cold, dry cereal  Souffles and omelets Fruit juices or nectars  Cooked cereals Chopped canned fruit  Chopped [place] toast or pancakes Canned fruit cocktail  Noodles or pasta (no rice) Pudding, mousse, custard  Cooked vegetables (no frozen peas, corn, or mixed vegetables) Green salad  Canned small sweet peas Ice cream  Creamed soup or vegetable soup Fruit ice, [place] ice  Pureed vegetable soup or alphabet soup Non-dairy creamer  Ground scalloped apples Margarine  Gravies Mayonnaise  Sauces:  Cheese, creamed, barbecue, tomato, white Ketchup  Coffee or tea Mustard   SAMPLE MENU:  LEVEL 4 Breakfast Lunch Dinner  Orange juice, 1/2 cup Oatmeal, 1/2 cup Scrambled eggs with cheese, 1/2 cup Decaffeinated tea, 1 cup Whole milk, 1 cup Non-dairy creamer, 2 Tbsp Ketchup, 1 Tbsp Margarine, 1 tsp Salt, 1/4 tsp Sugar, 2 tsp Pineapple juice, 1/2 cup Ground beef, 3 oz Gravy, 2 Tbsp Mashed potatoes, 1/2 cup Cooked spinach, 1/2 cup Applesauce, 1/2 cup Decaffeinated coffee Whole milk Non-dairy creamer, 2 Tbsp Margarine, 1 tsp Salt, 1/4 tsp Pureed turkey, barley soup, 3/4 cup Barbecue chicken, 3 oz Mashed potatoes, 1/2 cup Ground fresh broccoli, 1/2 cup Frozen yogurt, 1/2 cup Decaffeinated tea, 1 cup Non-dairy creamer, 2 Tbsp Margarine, 1 tsp Salt, 1/4 tsp Sugar, 1 tsp    LEVEL 5:  REGULAR FOODS  -Foods in this group are soft, moist, regularly textured foods.   -This level includes meat and breads, which tend to be the hardest things to swallow.   -Eat very slowly, chew well and continue to avoid carbonated drinks. -most people are at this level in 6 weeks  Hot Foods Cold Foods  Baked fish or  skinned Soft cheeses - cottage cheese  Souffles and omelets Cream cheese  Eggs Yogurt  Stuffed shells Milk  Spaghetti with meat sauce Milkshakes  Cooked cereal Cold dry cereals (no nuts, dried fruit, coconut)  [place] toast or pancakes Crackers  Buttered toast Fruit juices or nectars  Noodles or pasta (no  rice) Canned fruit  Potatoes (all types) Ripe bananas  Soft, cooked vegetables (no corn, lima, or baked beans) Peeled, ripe, fresh fruit  Creamed soups or vegetable soup Cakes (no nuts, dried fruit, coconut)  Canned chicken noodle soup Plain doughnuts  Gravies Ice cream  Bacon dressing Pudding, mousse, custard  Sauces:  Cheese, creamed, barbecue, tomato, white Fruit ice, [place] ice, sherbet  Decaffeinated tea or coffee Whipped gelatin  Pork chops Regular gelatin   Canned fruited gelatin molds   Sugar, syrup, honey, jam, jelly   Cream   Non-dairy   Margarine   Oil   Mayonnaise   Ketchup   Mustard   TROUBLESHOOTING IRREGULAR BOWELS  1) Avoid extremes of bowel movements (no bad constipation/diarrhea)  2) Miralax 17gm mixed in 8oz. water  or juice-daily. May use BID as needed.  3) Gas-x,Phazyme, etc. as needed for gas & bloating.  4) Soft,bland diet. No spicy,greasy,fried foods.  5) Prilosec over-the-counter as needed  6) May hold gluten/wheat products from diet to see if symptoms improve.  7) May try probiotics (Align, Activa, etc) to help calm the bowels down  7) If symptoms become worse call back immediately.

## 2023-08-28 NOTE — TOC Initial Note (Signed)
 Transition of Care [facility]) - Initial/Assessment Note    Patient Details  Name: Casey Woods MRN: [id number] Date of Birth: 1945-04-20  Transition of [facility]) CM/SW Contact:    Levie Ream, RN Phone Number: 08/28/2023, 11:46 AM  Clinical Narrative:                 Eldora Greet w/ pt in room; pt says she lives at home w/ dtr; she plans to return at d/c; pt identified POC sister Lorri Rota ([phone]); family will provide transportation; she verified insurance/PCP; pt denied SDOH risks; pt says she does not have DME, HH services, or home oxygen; no TOC needs; TOC signing off; please place consult if needed.  Expected Discharge Plan: Home/Self Care Barriers to Discharge: Continued Medical Work up   Patient Goals and CMS Choice Patient states their goals for this hospitalization and ongoing recovery are:: home CMS Medicare.gov Compare Post Acute Care list provided to:: Patient   [place] ownership interest in [facility].provided to:: Patient    Expected Discharge Plan and Services   Discharge Planning Services: CM Consult   Living arrangements for the past 2 months: Single Family Home                                      Prior Living Arrangements/Services Living arrangements for the past 2 months: Single Family Home Lives with:: Adult Children Patient language and need for interpreter reviewed:: Yes Do you feel safe going back to the place where you live?: Yes      Need for Family Participation in Patient Care: Yes (Comment) Care giver support system in place?: Yes (comment) Current home services:  (n/a) Criminal Activity/Legal Involvement Pertinent to Current Situation/Hospitalization: No - Comment as needed  Activities of Daily Living   ADL Screening (condition at time of admission) Independently performs ADLs?: Yes (appropriate for developmental age) Is the patient deaf or have difficulty hearing?: No Does the patient have difficulty  seeing, even when wearing glasses/contacts?: Yes Does the patient have difficulty concentrating, remembering, or making decisions?: No  Permission Sought/Granted Permission sought to share information with : Case Manager Permission granted to share information with : Yes, Verbal Permission Granted  Share Information with NAME: Case Manager     Permission granted to share info w Relationship: Lorri Rota (sister) [phone]     Emotional Assessment Appearance:: Appears stated age Attitude/Demeanor/Rapport: Gracious Affect (typically observed): Accepting Orientation: : Oriented to Self, Oriented to Place, Oriented to  Time, Oriented to Situation Alcohol / Substance Use: Not Applicable Psych Involvement: No (comment)  Admission diagnosis:  Hiatal hernia [K44.9] Gastroesophageal reflux disease without esophagitis [K21.9] Hiatal hernia with GERD [K44.9, K21.9] Patient Active Problem List   Diagnosis Date Noted   Hiatal hernia 08/27/2023   Hiatal hernia with GERD 08/27/2023   Elevated blood-pressure reading without diagnosis of hypertension 05/20/2021   Gastroesophageal reflux disease 05/20/2021   History of skin disorder 05/20/2021   Low back pain 05/20/2021   Nocturia 05/20/2021   Osteopenia 05/20/2021   Prediabetes 05/20/2021   Pure hypercholesterolemia 05/20/2021   Vitamin D deficiency 05/20/2021   Paresthesia 05/20/2021   Varicose veins of bilateral lower extremities with other complications 04/07/2011   Chronic venous insufficiency 10/24/2010   PCP:  Imelda Man, MD Pharmacy:   [id number] - [place], [place] - [address] [place] [address] Phone: [phone] Fax: [phone]  [facility] - [place], [place] - [address], [address], [address] [place] [address] Phone: [phone] Fax: [phone]     Social Drivers of Health (SDOH) Social History: SDOH  Screenings   Food Insecurity: No Food Insecurity (08/28/2023)  Housing: Low Risk  (08/28/2023)  Transportation Needs: No Transportation Needs (08/28/2023)  Utilities: Not At Risk (08/28/2023)  Alcohol Screen: Low Risk  (11/11/2022)  Physical Activity: Sufficiently Active (11/11/2022)  Social Connections: Moderately Integrated (08/27/2023)  Tobacco Use: Medium Risk (08/27/2023)   SDOH Interventions: Food Insecurity Interventions: Intervention Not Indicated, Inpatient TOC Housing Interventions: Intervention Not Indicated, Inpatient TOC Transportation Interventions: Intervention Not Indicated, Inpatient TOC Utilities Interventions: Intervention Not Indicated, Inpatient TOC   Readmission Risk Interventions     No data to display

## 2023-08-28 NOTE — Progress Notes (Signed)
 1 Day Post-Op   Subjective/Chief Complaint: No n/v CLD going ok Still having some discomfort in her esophagus/lower chest Ambulating No pain with swallowing   Objective: Vital signs in last 24 hours: Temp:  [96.7 F (35.9 C)-98.9 F (37.2 C)] 98.6 F (37 C) (06/07 0539) Pulse Rate:  [57-76] 69 (06/07 0539) Resp:  [12-19] 18 (06/07 0539) BP: (112-160)/(44-83) 112/49 (06/07 0539) SpO2:  [90 %-99 %] 92 % (06/07 0539) Last BM Date : 08/26/23  Intake/Output from previous day: 06/06 0701 - 06/07 0700 In: 3075.7 [P.O.:360; I.V.:2621.2; IV Piggyback:94.4] Out: 1220 [Urine:1200; Blood:20] Intake/Output this shift: No intake/output data recorded.  Alert, nontoxic, nad Symm chest rise Reg Soft, mild approp TTP, incisions ok Bluish/purpulish discoloration under b/l breasts - present after surgery -where surgical drapes were. Not worse  Lab Results:  Recent Labs    08/28/23 0459  WBC 10.9*  HGB 12.2  HCT 37.3  PLT 220   BMET Recent Labs    08/28/23 0459  NA 136  K 4.4  CL 103  CO2 23  GLUCOSE 154*  BUN 12  CREATININE 0.74  CALCIUM  9.0   PT/INR No results for input(s): "LABPROT", "INR" in the last 72 hours. ABG No results for input(s): "PHART", "HCO3" in the last 72 hours.  Invalid input(s): "PCO2", "PO2"  Studies/Results: No results found.  Anti-infectives: Anti-infectives (From admission, onward)    Start     Dose/Rate Route Frequency Ordered Stop   08/27/23 0600  vancomycin  (VANCOCIN ) IVPB 1000 mg/200 mL premix        1,000 mg 200 mL/hr over 60 Minutes Intravenous On call to O.R. 08/27/23 0547 08/27/23 0752       Assessment/Plan: s/p Procedure(s): REPAIR, HERNIA, HIATAL, LAPAROSCOPIC (N/A) FUNDOPLICATION, STOMACH, INCISIONLESS, ORAL APPROACH (N/A) EGD (ESOPHAGOGASTRODUODENOSCOPY) (N/A)  Doing well Adv to FLD this am.  Discussed importance of thinner FLD - thin grits Start protein shakes If tolerates FLD will dc later this am Discussed dc  instructions and diet progresssion and tips to minimize GI issues after this type of procedure  Marianna Shirk. Elvan Hamel, MD, FACS General, Bariatric, & Minimally Invasive Surgery [facility],  [facility]   LOS: 0 days    Aldean Hummingbird 08/28/2023

## 2023-08-28 NOTE — Progress Notes (Signed)
 Mobility Specialist - Progress Note   08/28/23 1036  Mobility  Activity Ambulated independently in hallway  Level of Assistance Independent  Assistive Device None  Distance Ambulated (ft) 500 ft  Activity Response Tolerated well  Mobility Referral Yes  Mobility visit 1 Mobility  Mobility Specialist Start Time (ACUTE ONLY) 1027  Mobility Specialist Stop Time (ACUTE ONLY) 1035  Mobility Specialist Time Calculation (min) (ACUTE ONLY) 8 min   Pt received in recliner and agreeable to mobility. No complaints during session. Pt to recliner after session with all needs met.    [facility]

## 2023-08-29 ENCOUNTER — Encounter (HOSPITAL_COMMUNITY): Payer: Self-pay | Admitting: General Surgery

## 2023-08-29 NOTE — Discharge Summary (Signed)
 Physician Discharge Summary  Casey Woods UJW:[id number] DOB: March 07, 1946 DOA: 08/27/2023  PCP: Imelda Man, MD  Admit date: 08/27/2023 Discharge date: 08/28/2023  Recommendations for Outpatient Follow-up:     Follow-up Information     Cirigliano, Laquetta Plank, DO. Go on 09/16/2023.   Specialty: Gastroenterology Why: at 140 PM, For wound re-check Contact information: [address] [place] [address] [phone]         Aldean Hummingbird, MD. Schedule an appointment as soon as possible for a visit in 4 week(s).   Specialty: General Surgery Why: For wound re-check Contact information: [address] [place] [address] [phone]                Discharge Diagnoses:  GERD/ hiatal hernia s/p repair Status post laparoscopic hiatal hernia repair with concomitant TIF procedure-transoral incisionless fundoplication  Surgical Procedure: Laparoscopic hiatal hernia repair-Dr. Elvan Hamel; TIF procedure by Dr. Karene Oto  Discharge Condition: Good Disposition: Home  Diet recommendation: Full liquid diet  Filed Weights   08/27/23 0640  Weight: 84.6 kg    History of present illness:  Patient came in for planned concomitant laparoscopic hiatal hernia repair with transoral incisionless fundoplication by Dr. Karene Oto for chronic GERD in the setting of sliding hiatal hernia.  Hospital Course:  Patient was brought in for planned concomitant laparoscopic hiatal hernia repair by Dr. Elvan Hamel along with transoral incision less fundoplication by Dr. Karene Oto.  She was kept overnight for observation.  She was started on clear liquids on postop day 0.  On postoperative day 1 she was tolerating clear liquids.  She did have some lower chest discomfort which was not unexpected.  She was continued on perioperative antiemetics, low-dose dexamethasone  and Pepcid  per protocol.  Her diet was advanced to full liquids which she tolerated.  Her vital signs were stable.  She was felt  stable for discharge.  We discussed diet advancement, discharge instructions.   Discharge Instructions  Discharge Instructions     Call MD for:   Complete by: As directed    Temperature >101   Call MD for:  hives   Complete by: As directed    Call MD for:  persistant dizziness or light-headedness   Complete by: As directed    Call MD for:  persistant nausea and vomiting   Complete by: As directed    Call MD for:  redness, tenderness, or signs of infection (pain, swelling, redness, odor or green/yellow discharge around incision site)   Complete by: As directed    Call MD for:  severe uncontrolled pain   Complete by: As directed    Diet full liquid   Complete by: As directed    Discharge instructions   Complete by: As directed    See [facility] discharge instructions   Increase activity slowly   Complete by: As directed       Allergies as of 08/28/2023       Reactions   Beef-derived Drug Products    headache   Rosuvastatin  Calcium  Other (See Comments)   Lethargy, joint pain, aching   Dexlansoprazole Rash   Esomeprazole Rash   Voquezna Dual Pak [amoxicillin & Vonoprazan] Rash        Medication List     STOP taking these medications    estradiol  0.1 MG/GM vaginal cream Commonly known as: ESTRACE  VAGINAL   Intrarosa  6.5 MG Inst Generic drug: Prasterone        TAKE these medications    acetaminophen  500 MG tablet Commonly  known as: TYLENOL  Take 2 tablets (1,000 mg total) by mouth every 8 (eight) hours for 5 days. What changed:  how much to take when to take this reasons to take this   CALCIUM -MAGNESIUM-ZINC PO Take 1 tablet by mouth in the morning and at bedtime.   clotrimazole-betamethasone lotion Commonly known as: LOTRISONE Apply 1 Application topically 2 (two) times daily as needed (irritation).   famotidine  20 MG tablet Commonly known as: PEPCID  Take 1 tablet (20 mg total) by mouth 2 (two) times daily.   lidocaine  2 % solution Commonly known as:  XYLOCAINE  Use as directed 5 mLs in the mouth or throat every 8 (eight) hours as needed for mouth pain (esophageal pain, odynophagia).   mometasone 0.1 % cream Commonly known as: ELOCON Apply 1 Application topically 2 (two) times daily as needed (irritation).   multivitamin with minerals tablet Take 1 tablet by mouth daily.   nystatin -triamcinolone  ointment Commonly known as: MYCOLOG Apply 1 Application topically daily. What changed:  when to take this reasons to take this   ondansetron  4 MG tablet Commonly known as: Zofran  Take 1 tablet (4 mg total) by mouth daily as needed for nausea or vomiting.   pravastatin  40 MG tablet Commonly known as: PRAVACHOL  TAKE 1 TABLET BY MOUTH EVERY DAY IN THE EVENING   promethazine 12.5 MG tablet Commonly known as: PHENERGAN Take 1 tablet (12.5 mg total) by mouth every 8 (eight) hours as needed for refractory nausea / vomiting.   simethicone  80 MG chewable tablet Commonly known as: MYLICON Chew 1 tablet (80 mg total) by mouth every 6 (six) hours as needed for flatulence (gas, bloating, abdominal discomfort).   traMADol  50 MG tablet Commonly known as: ULTRAM  Take 1 tablet (50 mg total) by mouth every 6 (six) hours as needed for moderate pain (pain score 4-6).   Vitamin D3 1000 units Caps Take 1,000 Units by mouth daily.        Follow-up Information     Cirigliano, Vito V, DO. Go on 09/16/2023.   Specialty: Gastroenterology Why: at 140 PM, For wound re-check Contact information: [address] [place] [address] [phone]         Aldean Hummingbird, MD. Schedule an appointment as soon as possible for a visit in 4 week(s).   Specialty: General Surgery Why: For wound re-check Contact information: [address] [place] [address] [phone]                  The results of significant diagnostics from this hospitalization (including imaging, microbiology, ancillary and laboratory) are listed below  for reference.    Significant Diagnostic Studies: No results found.  Microbiology: No results found for this or any previous visit (from the past 240 hours).   Labs: Basic Metabolic Panel: Recent Labs  Lab 08/23/23 1316 08/28/23 0459  NA 140 136  K 3.8 4.4  CL 107 103  CO2 25 23  GLUCOSE 109* 154*  BUN 15 12  CREATININE 0.73 0.74  CALCIUM  9.4 9.0  MG  --  2.2   Liver Function Tests: Recent Labs  Lab 08/23/23 1316  AST 33  ALT 30  ALKPHOS 52  BILITOT 0.4  PROT 6.8  ALBUMIN 3.9   No results for input(s): "LIPASE", "AMYLASE" in the last 168 hours. No results for input(s): "AMMONIA" in the last 168 hours. CBC: Recent Labs  Lab 08/23/23 1316 08/28/23 0459  WBC 6.0 10.9*  NEUTROABS 3.8  --   HGB  12.4 12.2  HCT 38.7 37.3  MCV 91.1 89.0  PLT 235 220   Cardiac Enzymes: No results for input(s): "CKTOTAL", "CKMB", "CKMBINDEX", "TROPONINI" in the last 168 hours. BNP: BNP (last 3 results) No results for input(s): "BNP" in the last 8760 hours.  ProBNP (last 3 results) No results for input(s): "PROBNP" in the last 8760 hours.  CBG: No results for input(s): "GLUCAP" in the last 168 hours.  Principal Problem:   Hiatal hernia with GERD Active Problems:   Hiatal hernia   Time coordinating discharge: 20 minutes  Signed:  Fran Imus, MD [facility] [phone] 08/29/2023, 11:33 AM

## 2023-08-30 ENCOUNTER — Encounter (HOSPITAL_COMMUNITY): Payer: Self-pay | Admitting: General Surgery

## 2023-09-13 ENCOUNTER — Other Ambulatory Visit (HOSPITAL_COMMUNITY): Payer: Self-pay

## 2023-09-16 ENCOUNTER — Encounter: Payer: Self-pay | Admitting: Gastroenterology

## 2023-09-16 ENCOUNTER — Ambulatory Visit: Admitting: Gastroenterology

## 2023-09-16 VITALS — BP 136/70 | Ht 69.0 in | Wt 186.5 lb

## 2023-09-16 DIAGNOSIS — K219 Gastro-esophageal reflux disease without esophagitis: Secondary | ICD-10-CM

## 2023-09-16 DIAGNOSIS — Z9889 Other specified postprocedural states: Secondary | ICD-10-CM

## 2023-09-16 NOTE — Progress Notes (Signed)
 Chief Complaint:    Postoperative follow-up  GI History: 78 year old female with a history of reflux symptoms starting around 2022, mainly regurgitation, belching.  Rare heartburn.  Symptoms tend to be postprandial.  Has trialed multiple acid suppression medications which have ultimately been efficacious, but has been complicated by various allergies and intolerances, namely rash to each of the different PPIs that she has tried.  Used Voquenza which worked, but was cost prohibitive.  Rash tends to occur within 2 weeks or so of starting PPI, then resolves after stopping PPI.  Most recently stopped all PPIs for about a month, but with ongoing reflux symptoms requiring regular use of Tums, so she restarted omeprazole 20 mg daily, again with good efficacy, but rash has started again on her upper extremities.   GERD history: -Index symptoms: Heartburn, regurgitation, belching.  No dysphagia -Exacerbating features: Acidic juices, soda -Medications trialed: Esomeprazole (rash), pantoprazole (rash), famotidine  (hair loss), Dexlansoprazole (eye twitching, rash), Voquenza (cost prohibitive), Carafate (nausea/vomiting), Tums -Current medications: Omeprazole 20 mg daily -Complications: Hiatal hernia   GERD evaluation: -Last EGD: 03/2023 -Barium esophagram: 01/2022: Small to medium hiatal hernia with nonobstructing distal esophageal ring, normal motility without mass or stricture.  Moderate reflux to the level of the aortic arch with water  siphon test, spontaneous reflux noted as well.  13 mm tablet passed without delay -Esophageal Manometry: None -pH/Impedance: None -Bravo: None -GES: 09/2022: Normal   Endoscopic History: - 08/31/2013: Colonoscopy: Hyperplastic polyp, sigmoid diverticulosis, otherwise normal.  Scheduled for repeat in 08/2023 with Dr. Burnette - 05/19/2018: EGD: Small hiatal hernia, Schatzki's ring, dilated.  Normal stomach and duodenum - 04/12/2023: EGD: 2 cm sliding hiatal hernia, Hill grade  3 valve, otherwise normal - 08/27/2023: Concomitant laparoscopic hiatal hernia pair and TIF (cTIF)  HPI:     Patient is a 78 y.o. female presenting to the Gastroenterology Clinic for postoperative follow-up after concomitant laparoscopic hiatal hernia pair and TIF on 08/27/2023.   She states that she feels well and no complaints at all.  Very happy with the result so far.  Not taking any acid suppression therapy and has had no breakthrough reflux symptoms since surgery.  Tolerating postoperative diet without issue.  She has follow-up scheduled with Dr. Tanda on 09/22/2023.    Review of systems:     No chest pain, no SOB, no fevers, no urinary sx   Past Medical History:  Diagnosis Date   Coronary artery abnormality    Edema leg    left > right   Elevated cholesterol    Family history of adverse reaction to anesthesia    Sick nausea, vomiting   GERD (gastroesophageal reflux disease)    Heart murmur    History of hiatal hernia    Migraine headache    Venous insufficiency (chronic) (peripheral)     Patient's surgical history, family medical history, social history, medications and allergies were all reviewed in Epic    Current Outpatient Medications  Medication Sig Dispense Refill   CALCIUM -MAGNESIUM-ZINC PO Take 1 tablet by mouth in the morning and at bedtime.     Cholecalciferol (VITAMIN D3) 1000 units CAPS Take 1,000 Units by mouth daily.     clotrimazole-betamethasone (LOTRISONE) lotion Apply 1 Application topically 2 (two) times daily as needed (irritation).     Coenzyme Q10 (CO Q-10) 100 MG CAPS Take 1 Capful by mouth every other day.     lidocaine  (XYLOCAINE ) 2 % solution Use as directed 5 mLs in the mouth or throat every 8 (eight)  hours as needed for mouth pain (esophageal pain, odynophagia). 100 mL 0   mometasone (ELOCON) 0.1 % cream Apply 1 Application topically 2 (two) times daily as needed (irritation).     Multiple Vitamins-Minerals (MULTIVITAMIN WITH MINERALS) tablet Take  1 tablet by mouth daily.     nystatin -triamcinolone  ointment (MYCOLOG) Apply 1 Application topically daily. (Patient taking differently: Apply 1 Application topically daily as needed (yeast).) 30 g 5   ondansetron  (ZOFRAN ) 4 MG tablet Take 1 tablet (4 mg total) by mouth daily as needed for nausea or vomiting. 30 tablet 1   pravastatin  (PRAVACHOL ) 40 MG tablet TAKE 1 TABLET BY MOUTH EVERY DAY IN THE EVENING (Patient taking differently: Take 40 mg by mouth every other day.) 90 tablet 1   promethazine  (PHENERGAN ) 12.5 MG tablet Take 1 tablet (12.5 mg total) by mouth every 8 (eight) hours as needed for refractory nausea / vomiting. 10 tablet 0   simethicone  (MYLICON) 80 MG chewable tablet Chew 1 tablet (80 mg total) by mouth every 6 (six) hours as needed for flatulence (gas, bloating, abdominal discomfort). 30 tablet 0   traMADol  (ULTRAM ) 50 MG tablet Take 1 tablet (50 mg total) by mouth every 6 (six) hours as needed for moderate pain (pain score 4-6). 15 tablet 0   No current facility-administered medications for this visit.    Physical Exam:     BP 136/70   Ht 5' 9 (1.753 m)   Wt 186 lb 8 oz (84.6 kg)   BMI 27.54 kg/m   GENERAL:  Pleasant female in NAD PSYCH: : Cooperative, normal affect Musculoskeletal:  Normal muscle tone, normal strength NEURO: Alert and oriented x 3, no focal neurologic deficits   IMPRESSION and PLAN:    1) GERD 2) Hiatal hernia Doing well after concomitant laparoscopic hiatal hernia pair and TIF.  Off all acid suppression therapy and has had no breakthrough reflux symptoms.  Tolerating postoperative diet without issue.  Very happy with results. - Continue advancing diet slowly per postoperative protocol - Continue advancing exercise/activity slowly per postoperative protocol - To follow-up with Dr. Tanda next week as scheduled - Can follow-up with me on an as-needed basis         Sandor LULLA Flatter ,DO, FACG 09/16/2023, 2:09 PM

## 2023-09-16 NOTE — Patient Instructions (Addendum)
 _______________________________________________________  If your blood pressure at your visit was 140/90 or greater, please contact your primary care physician to follow up on this. _______________________________________________________  If you are age 76 or older, your body mass index should be between 23-30. Your Body mass index is 27.54 kg/m. If this is out of the aforementioned range listed, please consider follow up with your Primary Care Provider. ________________________________________________________  The [facility] providers would like to encourage you to use MYCHART to communicate with providers for non-urgent requests or questions.  Due to long hold times on the telephone, sending your provider a message by [facility] may be a faster and more efficient way to get a response.  Please allow 48 business hours for a response.  Please remember that this is for non-urgent requests.  _______________________________________________________  It was a pleasure to see you today!  Vito Cirigliano, D.O.

## 2023-11-06 ENCOUNTER — Other Ambulatory Visit (HOSPITAL_BASED_OUTPATIENT_CLINIC_OR_DEPARTMENT_OTHER): Payer: Self-pay | Admitting: Cardiovascular Disease

## 2024-02-05 ENCOUNTER — Other Ambulatory Visit (HOSPITAL_BASED_OUTPATIENT_CLINIC_OR_DEPARTMENT_OTHER): Payer: Self-pay | Admitting: Cardiovascular Disease

## 2024-02-08 ENCOUNTER — Telehealth: Payer: Self-pay | Admitting: Cardiovascular Disease

## 2024-02-08 DIAGNOSIS — E785 Hyperlipidemia, unspecified: Secondary | ICD-10-CM

## 2024-02-08 DIAGNOSIS — Z5181 Encounter for therapeutic drug level monitoring: Secondary | ICD-10-CM

## 2024-02-08 MED ORDER — NEXLIZET 180-10 MG PO TABS: 1.0000 | ORAL_TABLET | Freq: Every day | ORAL | 3 refills | Status: AC

## 2024-02-08 NOTE — Telephone Encounter (Signed)
 Triage team:  She may stop Pravastatin . Did not previously tolerate Rosuvastatin .  Per Michelle's last note, her options are to consider PCSK9 inhibitor or bempedoic acid if intolerant. Please inquire if she would rather start Repatha 140mg  SQ injection q14 days or Bempedoic acid-ezetimibe (Nexlizet) one tablet daily. Repeat FLP 3 months after starting new Rx.  She is also overdue for follow up. Please assist to schedule. If she would like to discuss medication changes at follow up that is fine.   Casey Woods S Casey Procida, NP

## 2024-02-08 NOTE — Telephone Encounter (Signed)
 Pt c/o medication issue:  1. Name of Medication:   pravastatin  (PRAVACHOL ) 40 MG tablet    2. How are you currently taking this medication (dosage and times per day)? As written   3. Are you having a reaction (difficulty breathing--STAT)? No   4. What is your medication issue? Pt states she has had muscles spasm, leg pain a;long with some cramps please advise

## 2024-02-08 NOTE — Telephone Encounter (Signed)
 Spoke with patient who preferred starting Nexlizet Sent Rx to pharmacy, mailed lab orders, and scheduled follow up

## 2024-03-20 NOTE — Telephone Encounter (Signed)
.  Patient was booked first availability on 04/18/24.  Patient is requesting a sooner appointment due to a open sore with drainage on the side of her rt foot x1wk. Please assist with request.   Pt can be reached at [phone]

## 2024-03-20 NOTE — Telephone Encounter (Signed)
 Pt rescheduled for 03/31/23 at 1:30 at the [facility] location with Dr Orland for Rt foot ulcer open/draining. Pt aware that she is to report to either PCP or Urgent Care if she has concerns for infection.   Pt agrees with plan. No further action needed.

## 2024-03-22 ENCOUNTER — Encounter (HOSPITAL_COMMUNITY): Payer: Self-pay | Admitting: *Deleted

## 2024-03-22 ENCOUNTER — Ambulatory Visit (HOSPITAL_COMMUNITY)
Admission: EM | Admit: 2024-03-22 | Discharge: 2024-03-22 | Disposition: A | Discharge status: Home / Self Care | Attending: Internal Medicine | Admitting: Internal Medicine

## 2024-03-22 DIAGNOSIS — L97512 Non-pressure chronic ulcer of other part of right foot with fat layer exposed: Secondary | ICD-10-CM | POA: Diagnosis not present

## 2024-03-22 DIAGNOSIS — I83015 Varicose veins of right lower extremity with ulcer other part of foot: Secondary | ICD-10-CM

## 2024-03-22 MED ORDER — DOXYCYCLINE HYCLATE 100 MG PO CAPS: 100.0000 mg | ORAL_CAPSULE | Freq: Two times a day (BID) | ORAL | 0 refills | Status: AC

## 2024-03-22 NOTE — Discharge Instructions (Addendum)
 Symptoms and physical exam findings are most consistent with cellulitis secondary to right medial foot ulceration due to severe venous stasis.  We will treat this with both topical antibiotics as well as antibiotics by mouth.  Continue to use compression stockings and elevate legs to help reduce swelling and venous pressure.  Recommend keeping follow-up appointment as scheduled with podiatry and if there is any worsening of symptoms can return to urgent care.  We will send in the following medication: Doxycycline 100 mg twice daily for 10 days. Take this with food.  Apply Silvadene twice daily to the affected areas.  If the areas are draining then recommend covering with a dry dressing May wash area with soap and water  or with antibacterial wound cleanser Recommend using Aquaphor or CeraVe healing ointment 2-3 times daily to help with the dry skin

## 2024-03-22 NOTE — ED Triage Notes (Signed)
 Pt states she has a wound on her right foot X 10 days she does have an appt with her foot doctor next week. She was advised if worse come to UC.   Pt states her left foot is red so she isnt sure if there will be a wound on it soon as well.   She has been using a antibiotic wound wash on both feet.

## 2024-03-22 NOTE — ED Provider Notes (Signed)
 " [facility]    CSN: [id number] Arrival date & time: 03/22/24  1103      History   Chief Complaint Chief Complaint  Patient presents with   Foot Pain    HPI Casey Woods is a 78 y.o. female.   78 year old female presents urgent care with complaints of an open wound on the right medial foot as well as redness and pain.  She reports that she has had a wound on her foot for almost 2 weeks now.  The area has gotten much more painful and more red.  She did contact the podiatry office that she is scheduled to see and requested an earlier appointment but the earliest they could see her was on 8 January.  They recommended coming to urgent care if she felt the area was infected.  She has had problems with varicose veins in her legs for years and wears compression stockings on a daily basis.  She has not had a history of any open wounds in the past.  She has never seen vascular surgery.   Foot Pain Pertinent negatives include no chest pain, no abdominal pain and no shortness of breath.    Past Medical History:  Diagnosis Date   Coronary artery abnormality    Edema leg    left > right   Elevated cholesterol    Family history of adverse reaction to anesthesia    Sick nausea, vomiting   GERD (gastroesophageal reflux disease)    Heart murmur    History of hiatal hernia    Migraine headache    Venous insufficiency (chronic) (peripheral)     Patient Active Problem List   Diagnosis Date Noted   Hiatal hernia 08/27/2023   Hiatal hernia with GERD 08/27/2023   Elevated blood-pressure reading without diagnosis of hypertension 05/20/2021   Gastroesophageal reflux disease 05/20/2021   History of skin disorder 05/20/2021   Low back pain 05/20/2021   Nocturia 05/20/2021   Osteopenia 05/20/2021   Prediabetes 05/20/2021   Pure hypercholesterolemia 05/20/2021   Vitamin D deficiency 05/20/2021   Paresthesia 05/20/2021   Varicose veins of bilateral lower extremities with other  complications 04/07/2011   Chronic venous insufficiency 10/24/2010    Past Surgical History:  Procedure Laterality Date   ABDOMINAL HYSTERECTOMY     BUNIONECTOMY Bilateral    COLONOSCOPY     ESOPHAGOGASTRODUODENOSCOPY N/A 08/27/2023   Procedure: EGD (ESOPHAGOGASTRODUODENOSCOPY);  Surgeon: San Sandor GAILS, DO;  Location: [facility];  Service: Gastroenterology;  Laterality: N/A;   HIATAL HERNIA REPAIR N/A 08/27/2023   Procedure: REPAIR, HERNIA, HIATAL, LAPAROSCOPIC;  Surgeon: Tanda Locus, MD;  Location: [facility];  Service: General;  Laterality: N/A;   LASER ABLATION     right - varicose veins   LEG SURGERY     TRANSORAL INCISIONLESS FUNDOPLICATION N/A 08/27/2023   Procedure: FUNDOPLICATION, STOMACH, INCISIONLESS, ORAL APPROACH;  Surgeon: San Sandor GAILS, DO;  Location: [facility];  Service: Gastroenterology;  Laterality: N/A;   UPPER GI ENDOSCOPY      OB History     Gravida  0   Para  0   Term  0   Preterm  0   AB  0   Living  0      SAB  0   IAB  0   Ectopic  0   Multiple  0   Live Births  0        Obstetric Comments  1 adopted daughter  Home Medications    Prior to Admission medications  Medication Sig Start Date End Date Taking? Authorizing Provider  Bempedoic Acid-Ezetimibe (NEXLIZET ) 180-10 MG TABS Take 1 tablet by mouth daily. 02/08/24  Yes Vannie Reche RAMAN, NP  CALCIUM -MAGNESIUM-ZINC PO Take 1 tablet by mouth in the morning and at bedtime.   Yes [provider]  Cholecalciferol (VITAMIN D3) 1000 units CAPS Take 1,000 Units by mouth daily.   Yes [provider]  clotrimazole-betamethasone (LOTRISONE) lotion Apply 1 Application topically 2 (two) times daily as needed (irritation).   Yes [provider]  Coenzyme Q10 (CO Q-10) 100 MG CAPS Take 1 Capful by mouth every other day.   Yes [provider]  doxycycline (VIBRAMYCIN) 100 MG capsule Take 1 capsule (100 mg total) by mouth 2 (two) times daily. 03/22/24  Yes  Feven Alderfer A, PA-C  Multiple Vitamins-Minerals (MULTIVITAMIN WITH MINERALS) tablet Take 1 tablet by mouth daily.   Yes [provider]  lidocaine  (XYLOCAINE ) 2 % solution Use as directed 5 mLs in the mouth or throat every 8 (eight) hours as needed for mouth pain (esophageal pain, odynophagia). 08/28/23   Tanda Locus, MD  mometasone (ELOCON) 0.1 % cream Apply 1 Application topically 2 (two) times daily as needed (irritation). 08/01/21   [provider]  nystatin -triamcinolone  ointment (MYCOLOG) Apply 1 Application topically daily. Patient taking differently: Apply 1 Application topically daily as needed (yeast). 09/02/22   Lavoie, Marie-Lyne, MD  ondansetron  (ZOFRAN ) 4 MG tablet Take 1 tablet (4 mg total) by mouth daily as needed for nausea or vomiting. 08/28/23 08/27/24  Tanda Locus, MD  promethazine  (PHENERGAN ) 12.5 MG tablet Take 1 tablet (12.5 mg total) by mouth every 8 (eight) hours as needed for refractory nausea / vomiting. 08/28/23   Tanda Locus, MD  simethicone  [facility]) 80 MG chewable tablet Chew 1 tablet (80 mg total) by mouth every 6 (six) hours as needed for flatulence (gas, bloating, abdominal discomfort). 08/28/23   Tanda Locus, MD  traMADol  (ULTRAM ) 50 MG tablet Take 1 tablet (50 mg total) by mouth every 6 (six) hours as needed for moderate pain (pain score 4-6). 08/28/23   Tanda Locus, MD    Family History Family History  Problem Relation Age of Onset   Other Mother        PAD w/ hx left BKA    Heart attack Father        died age 29   Heart disease Brother        died age 54- CHF   Liver disease Neg Hx    Colon cancer Neg Hx    Esophageal cancer Neg Hx    Pancreatic cancer Neg Hx    Stomach cancer Neg Hx     Social History Social History[1]   Allergies   Bovine (beef) protein-containing drug products, Rosuvastatin  calcium , Pravastatin , Dexlansoprazole, Esomeprazole, and Voquezna dual pak [amoxicillin & vonoprazan]   Review of Systems Review of  Systems  Constitutional:  Negative for chills and fever.  HENT:  Negative for ear pain and sore throat.   Eyes:  Negative for pain and visual disturbance.  Respiratory:  Negative for cough and shortness of breath.   Cardiovascular:  Negative for chest pain and palpitations.  Gastrointestinal:  Negative for abdominal pain and vomiting.  Genitourinary:  Negative for dysuria and hematuria.  Musculoskeletal:  Negative for arthralgias and back pain.  Skin:  Positive for color change and wound. Negative for rash.  Neurological:  Negative for seizures and syncope.  All other systems reviewed and are negative.    Physical Exam Triage Vital Signs ED Triage Vitals  Encounter Vitals Group     BP 03/22/24 1209 (!) 163/73     Girls Systolic BP Percentile --      Girls Diastolic BP Percentile --      Boys Systolic BP Percentile --      Boys Diastolic BP Percentile --      Pulse Rate 03/22/24 1209 (!) 52     Resp 03/22/24 1209 16     Temp 03/22/24 1209 97.9 F (36.6 C)     Temp Source 03/22/24 1209 Oral     SpO2 03/22/24 1209 96 %     Weight --      Height --      Head Circumference --      Peak Flow --      Pain Score 03/22/24 1207 8     Pain Loc --      Pain Education --      Exclude from Growth Chart --    No data found.  Updated Vital Signs BP (!) 163/73 (BP Location: Left Arm)   Pulse (!) 52   Temp 97.9 F (36.6 C) (Oral)   Resp 16   SpO2 96%   Visual Acuity Right Eye Distance:   Left Eye Distance:   Bilateral Distance:    Right Eye Near:   Left Eye Near:    Bilateral Near:     Physical Exam Vitals and nursing note reviewed.  Constitutional:      General: She is not in acute distress.    Appearance: She is well-developed.  HENT:     Head: Normocephalic and atraumatic.  Eyes:     Conjunctiva/sclera: Conjunctivae normal.  Cardiovascular:     Rate and Rhythm: Normal rate and regular rhythm.     Pulses:          Dorsalis pedis pulses are 2+ on the right side  and 2+ on the left side.       Posterior tibial pulses are 2+ on the right side and 2+ on the left side.     Heart sounds: No murmur heard. Pulmonary:     Effort: Pulmonary effort is normal. No respiratory distress.     Breath sounds: Normal breath sounds.  Abdominal:     Palpations: Abdomen is soft.     Tenderness: There is no abdominal tenderness.  Musculoskeletal:        General: No swelling.     Cervical back: Neck supple.       Feet:  Feet:     Comments: Severe stasis changes are evident in both feet and both legs Skin:    General: Skin is warm and dry.     Capillary Refill: Capillary refill takes less than 2 seconds.  Neurological:     Mental Status: She is alert.  Psychiatric:        Mood and Affect: Mood normal.      UC Treatments / Results  Labs (all labs ordered are listed, but only abnormal results are displayed) Labs Reviewed - No data to display  EKG   Radiology No results found.  Procedures Procedures (including critical care time)  Medications Ordered in UC Medications - No data to display  Initial Impression / Assessment and Plan / UC Course  I have reviewed the triage vital signs and the nursing notes.  Pertinent labs & imaging results that were available during my  care of the patient were reviewed by me and considered in my medical decision making (see chart for details).     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  We will treat this with both topical antibiotics as well as antibiotics by mouth.  Continue to use compression stockings and elevate legs to help reduce swelling and venous pressure.  Recommend keeping follow-up appointment as scheduled with podiatry and if there is any worsening of  symptoms can return to urgent care.  We will send in the following medication: Doxycycline 100 mg twice daily for 10 days. Take this with food.  Apply Silvadene twice daily to the affected areas.  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  We will treat this with both topical antibiotics as well as antibiotics by mouth.  Continue to use compression stockings and elevate legs to help reduce swelling and venous pressure.  Recommend keeping follow-up appointment as scheduled with podiatry and if there is any worsening of symptoms can return to urgent care.  We will send in the following medication: Doxycycline 100 mg twice daily for 10 days. Take this with food.  Apply Silvadene twice daily to the affected areas.  If the areas are draining then recommend covering with a dry dressing May wash area with soap and water  or with antibacterial wound cleanser Recommend using Aquaphor or CeraVe healing ointment 2-3 times daily to help with the dry skin    ED Prescriptions     Medication Sig Dispense Auth. Provider   doxycycline (VIBRAMYCIN) 100 MG capsule Take 1 capsule (100 mg total) by mouth 2 (two) times daily. 20 capsule Teresa Almarie LABOR, [place]      PDMP not reviewed this encounter.    [1]  Social History Tobacco Use   Smoking status: Former    Types: Cigarettes    Passive exposure: Past   Smokeless tobacco:  Never  Vaping Use   Vaping status: Never Used  Substance Use Topics   Alcohol use: Yes    Comment: SOCIAL   Drug use: No     Teresa Almarie LABOR, PA-C 03/22/24 1251  "

## 2024-05-10 ENCOUNTER — Encounter (HOSPITAL_BASED_OUTPATIENT_CLINIC_OR_DEPARTMENT_OTHER): Admitting: Nurse Practitioner

## 2024-05-12 ENCOUNTER — Encounter: Admitting: Obstetrics and Gynecology
